# Patient Record
Sex: Female | Born: 1935
Health system: Southern US, Community
[De-identification: ages and names within clinical notes are randomized; demographics above are authoritative.]

## PROBLEM LIST (undated history)

## (undated) DIAGNOSIS — R0989 Other specified symptoms and signs involving the circulatory and respiratory systems: Secondary | ICD-10-CM

## (undated) DIAGNOSIS — M810 Age-related osteoporosis without current pathological fracture: Secondary | ICD-10-CM

## (undated) HISTORY — DX: Age-related osteoporosis without current pathological fracture: M81.0

## (undated) HISTORY — DX: Other specified symptoms and signs involving the circulatory and respiratory systems: R09.89

## (undated) HISTORY — PX: OTHER SURGICAL HISTORY: SHX169

## (undated) HISTORY — PX: TONSILLECTOMY: SHX5217

---

## 2006-11-18 ENCOUNTER — Ambulatory Visit: Payer: Self-pay | Admitting: Internal Medicine

## 2006-11-18 DIAGNOSIS — M810 Age-related osteoporosis without current pathological fracture: Secondary | ICD-10-CM | POA: Insufficient documentation

## 2007-08-11 HISTORY — PX: CATARACT EXTRACTION: SUR2

## 2007-08-27 ENCOUNTER — Ambulatory Visit (HOSPITAL_COMMUNITY): Admission: RE | Admit: 2007-08-27 | Discharge: 2007-08-27 | Payer: Self-pay | Admitting: Internal Medicine

## 2007-10-09 ENCOUNTER — Ambulatory Visit: Payer: Self-pay | Admitting: Internal Medicine

## 2007-10-09 DIAGNOSIS — D239 Other benign neoplasm of skin, unspecified: Secondary | ICD-10-CM

## 2007-10-09 DIAGNOSIS — R0989 Other specified symptoms and signs involving the circulatory and respiratory systems: Secondary | ICD-10-CM

## 2007-10-09 LAB — CONVERTED CEMR LAB
Albumin: 4.1 g/dL (ref 3.5–5.2)
BUN: 16 mg/dL (ref 6–23)
Basophils Absolute: 0.1 10*3/uL (ref 0.0–0.1)
CO2: 28 meq/L (ref 19–32)
Cholesterol: 228 mg/dL (ref 0–200)
Creatinine, Ser: 0.8 mg/dL (ref 0.4–1.2)
GFR calc non Af Amer: 73 mL/min
Glucose, Bld: 102 mg/dL — ABNORMAL HIGH (ref 70–99)
HCT: 39.2 % (ref 36.0–46.0)
Hemoglobin: 13.8 g/dL (ref 12.0–15.0)
MCHC: 35.2 g/dL (ref 30.0–36.0)
Neutrophils Relative %: 50.4 % (ref 43.0–77.0)
Platelets: 260 10*3/uL (ref 150–400)
RBC: 4.33 M/uL (ref 3.87–5.11)
RDW: 12.6 % (ref 11.5–14.6)
Sodium: 139 meq/L (ref 135–145)
TSH: 1.35 microintl units/mL (ref 0.35–5.50)
Total Bilirubin: 0.9 mg/dL (ref 0.3–1.2)
Triglycerides: 62 mg/dL (ref 0–149)
WBC: 3.6 10*3/uL — ABNORMAL LOW (ref 4.5–10.5)

## 2007-10-16 ENCOUNTER — Encounter: Payer: Self-pay | Admitting: Internal Medicine

## 2007-10-16 ENCOUNTER — Ambulatory Visit: Payer: Self-pay

## 2007-11-03 ENCOUNTER — Telehealth: Payer: Self-pay | Admitting: Internal Medicine

## 2007-12-12 ENCOUNTER — Encounter: Payer: Self-pay | Admitting: Internal Medicine

## 2008-08-04 ENCOUNTER — Telehealth: Payer: Self-pay | Admitting: Internal Medicine

## 2008-08-27 ENCOUNTER — Ambulatory Visit (HOSPITAL_COMMUNITY): Admission: RE | Admit: 2008-08-27 | Discharge: 2008-08-27 | Payer: Self-pay | Admitting: Internal Medicine

## 2008-08-27 ENCOUNTER — Encounter: Payer: Self-pay | Admitting: Internal Medicine

## 2009-01-05 ENCOUNTER — Encounter: Payer: Self-pay | Admitting: Internal Medicine

## 2009-01-05 ENCOUNTER — Ambulatory Visit: Payer: Self-pay

## 2009-01-10 ENCOUNTER — Ambulatory Visit: Payer: Self-pay | Admitting: Internal Medicine

## 2009-01-13 LAB — CONVERTED CEMR LAB
Albumin: 4.1 g/dL (ref 3.5–5.2)
Alkaline Phosphatase: 70 units/L (ref 39–117)
BUN: 21 mg/dL (ref 6–23)
Basophils Absolute: 0.1 10*3/uL (ref 0.0–0.1)
Cholesterol: 216 mg/dL — ABNORMAL HIGH (ref 0–200)
Direct LDL: 139.1 mg/dL
GFR calc non Af Amer: 72.71 mL/min (ref >60)
Hemoglobin: 13.8 g/dL (ref 12.0–15.0)
Lymphocytes Relative: 34.4 % (ref 12.0–46.0)
Lymphs Abs: 1.3 10*3/uL (ref 0.7–4.0)
MCHC: 34.7 g/dL (ref 30.0–36.0)
MCV: 95 fL (ref 78.0–100.0)
Neutro Abs: 2.1 10*3/uL (ref 1.4–7.7)
Potassium: 4.3 meq/L (ref 3.5–5.1)
RBC: 4.18 M/uL (ref 3.87–5.11)
RDW: 12.3 % (ref 11.5–14.6)
TSH: 1.27 microintl units/mL (ref 0.35–5.50)
Total Protein: 7.1 g/dL (ref 6.0–8.3)
WBC: 3.8 10*3/uL — ABNORMAL LOW (ref 4.5–10.5)

## 2009-02-09 ENCOUNTER — Ambulatory Visit: Payer: Self-pay | Admitting: Internal Medicine

## 2009-04-04 ENCOUNTER — Encounter (INDEPENDENT_AMBULATORY_CARE_PROVIDER_SITE_OTHER): Payer: Self-pay | Admitting: *Deleted

## 2009-04-05 ENCOUNTER — Ambulatory Visit: Payer: Self-pay | Admitting: Internal Medicine

## 2009-04-21 ENCOUNTER — Ambulatory Visit: Payer: Self-pay | Admitting: Internal Medicine

## 2009-08-10 LAB — HM MAMMOGRAPHY: HM Mammogram: NEGATIVE

## 2009-10-04 ENCOUNTER — Ambulatory Visit (HOSPITAL_COMMUNITY): Admission: RE | Admit: 2009-10-04 | Discharge: 2009-10-04 | Payer: Self-pay | Admitting: Internal Medicine

## 2009-12-14 ENCOUNTER — Encounter: Payer: Self-pay | Admitting: Internal Medicine

## 2009-12-15 ENCOUNTER — Encounter: Payer: Self-pay | Admitting: Internal Medicine

## 2010-04-11 NOTE — Procedures (Signed)
Summary: Colonoscopy  Patient: [name] Note: All result statuses are Final unless otherwise noted.  Tests: (1) Colonoscopy (COL)   COL Colonoscopy           DONE     [facility]     [address]     [place], [place]  [address]           COLONOSCOPY PROCEDURE REPORT           PATIENT:  Tina Schroeder, Tina Schroeder  MR#:  [id number]     BIRTHDATE:  17-Aug-1923, 85 yrs. old  GENDER:  female           ENDOSCOPIST:  Iva Boop, MD, [facility]           PROCEDURE DATE:  04/21/2009     PROCEDURE:  Incomplete colonoscopy     ASA CLASS:  Class II     INDICATIONS:  Routine Risk Screening (no previouscolonoscopy)           MEDICATIONS:   Fentanyl 50 mcg IV, Versed 5 mg IV           DESCRIPTION OF PROCEDURE:   After the risks benefits and     alternatives of the procedure were thoroughly explained, informed     consent was obtained.  Digital rectal exam was performed and     revealed no abnormalities.   The LB [id number]-[id number] endoscope     was introduced through the anus and advanced to the sigmoid colon,     limited by stenosis.  There is angulation and stenosis with fixed     sigmoid colon that prevented passage of the colonoscope (adult and     pediatric adjustable) desoite abdominal pressure and change from     left lateral decubitis to supine.   The quality of the prep was     excellent, using MoviPrep.  The instrument was then slowly     withdrawn as the colon was fully examined.     <<PROCEDUREIMAGES>>           FINDINGS:  Mild diverticulosis was found in the sigmoid colon.     there was stenosis in the sigmoid colon. Angulated, fixed and     stenotic sigmoid at about 30 cm. Unable to pass, see above.  This     was otherwise a normal examination (incomplete).   Retroflexed     views in the rectum revealed no abnormalities.    The scope was     then withdrawn from the patient and the procedure completed.           COMPLICATIONS:  None           ENDOSCOPIC IMPRESSION:  1) Mild diverticulosis in the sigmoid colon     2) Stenosis in the sigmoid colon - preventing further     examination ofthe colon     3) Otherwise normal examination - incomplete exam     RECOMMENDATIONS:     She might want to pursue CT colonoscopy. Will discuss.           REPEAT EXAM:  No           Iva Boop, MD, [facility]           CC:  The Patient           n.     eSIGNED:   Iva Boop at 04/21/2009 12:03 PM           Plaia,  Thersia, Petraglia [id number]  Note: An exclamation mark (!) indicates a result that was not dispersed into the flowsheet. Document Creation Date: 04/21/2009 12:04 PM _______________________________________________________________________  (1) Order result status: Final Collection or observation date-time: 04/21/2009 11:56 Requested date-time:  Receipt date-time:  Reported date-time:  Referring Physician:   Ordering Physician: Stan Head [phone]) Specimen Source:  Source: Launa Grill Order Number: [id number] Lab site:

## 2010-04-11 NOTE — Miscellaneous (Signed)
Summary: [facility] Previsit/prep  Clinical Lists Changes  Medications: Added new medication of MOVIPREP 100 GM  SOLR (PEG-KCL-NACL-NASULF-NA ASC-C) As per prep instructions. - Signed Rx of MOVIPREP 100 GM  SOLR (PEG-KCL-NACL-NASULF-NA ASC-C) As per prep instructions.;  #1 x 0;  Signed;  Entered by: Wyona Almas RN;  Authorized by: Iva Boop MD, [facility];  Method used: Electronically to [facility]. [place]. [phone]*, [address], [place], [place], [place]  [address], Ph: [phone] or [phone], Fax: [phone] Observations: Added new observation of NKA: T (04/05/2009 13:39)    Prescriptions: MOVIPREP 100 GM  SOLR (PEG-KCL-NACL-NASULF-NA ASC-C) As per prep instructions.  #1 x 0   Entered by:   Wyona Almas RN   Authorized by:   Iva Boop MD, [facility]   Signed by:   Wyona Almas RN on 04/05/2009   Method used:   Electronically to        [facility]. [address] [phone]* (retail)       [address]       [place], [place]  [address]       Ph: [phone] or [phone]       Fax: [phone]   RxID:   [id number]

## 2010-04-11 NOTE — Miscellaneous (Signed)
Summary: Flu Shot/[facility]  Flu Shot/[facility]   Imported By: Maryln Gottron 12/19/2009 12:16:19  _____________________________________________________________________  External Attachment:    Type:   Image     Comment:   External Document

## 2010-04-11 NOTE — Miscellaneous (Signed)
Summary: flu vaccine  Clinical Lists Changes  Observations: Added new observation of FLU VAX: Historical (12/14/2009 11:54)      Immunization History:  Tetanus/Td Immunization History:    Tetanus/Td:  Td (01/10/2009)  Influenza Immunization History:    Influenza:  Historical (12/14/2009)  Pneumovax Immunization History:    Pneumovax:  Pneumovax (03/12/2002) given at [facility]

## 2010-04-11 NOTE — Letter (Signed)
Summary: [facility] Instructions  [facility]  [address] [place], [place] [address]   Phone: [phone]  Fax: [phone]       NIMSI MALES    1923/06/29    MRN: [id number]        Procedure Day Dorna Bloom:  Lenor Coffin  04/21/09     Arrival Time:  10:00AM     Procedure Time:  11:00AM     Location of Procedure:                    Juliann Pares _  [facility] (4th Floor)                        PREPARATION FOR COLONOSCOPY WITH MOVIPREP   Starting 5 days prior to your procedure 04/16/09 do not eat nuts, seeds, popcorn, corn, beans, peas,  salads, or any raw vegetables.  Do not take any fiber supplements (e.g. Metamucil, Citrucel, and Benefiber).  THE DAY BEFORE YOUR PROCEDURE         DATE: 04/20/09  DAY: WEDNESDAY  1.  Drink clear liquids the entire day-NO SOLID FOOD  2.  Do not drink anything colored red or purple.  Avoid juices with pulp.  No orange juice.  3.  Drink at least 64 oz. (8 glasses) of fluid/clear liquids during the day to prevent dehydration and help the prep work efficiently.  CLEAR LIQUIDS INCLUDE: Water Jello Ice Popsicles Tea (sugar ok, no milk/cream) Powdered fruit flavored drinks Coffee (sugar ok, no milk/cream) Gatorade Juice: apple, white grape, white cranberry  Lemonade Clear bullion, consomm, broth Carbonated beverages (any kind) Strained chicken noodle soup Hard Candy                             4.  In the morning, mix first dose of MoviPrep solution:    Empty 1 Pouch A and 1 Pouch B into the disposable container    Add lukewarm drinking water to the top line of the container. Mix to dissolve    Refrigerate (mixed solution should be used within 24 hrs)  5.  Begin drinking the prep at 5:00 p.m. The MoviPrep container is divided by 4 marks.   Every 15 minutes drink the solution down to the next mark (approximately 8 oz) until the full liter is complete.   6.  Follow completed prep with 16 oz of clear liquid of your choice  (Nothing red or purple).  Continue to drink clear liquids until bedtime.  7.  Before going to bed, mix second dose of MoviPrep solution:    Empty 1 Pouch A and 1 Pouch B into the disposable container    Add lukewarm drinking water to the top line of the container. Mix to dissolve    Refrigerate  THE DAY OF YOUR PROCEDURE      DATE: 04/21/09 DAY: THURSDAY  Beginning at 6:00AM (5 hours before procedure):         1. Every 15 minutes, drink the solution down to the next mark (approx 8 oz) until the full liter is complete.  2. Follow completed prep with 16 oz. of clear liquid of your choice.    3. You may drink clear liquids until 9:00AM (2 HOURS BEFORE PROCEDURE).   MEDICATION INSTRUCTIONS  Unless otherwise instructed, you should take regular prescription medications with a small sip of water   as early as possible the morning of  your procedure.        OTHER INSTRUCTIONS  You will need a responsible adult at least 75 years of age to accompany you and drive you home.   This person must remain in the waiting room during your procedure.  Wear loose fitting clothing that is easily removed.  Leave jewelry and other valuables at home.  However, you may wish to bring a book to read or  an iPod/MP3 player to listen to music as you wait for your procedure to start.  Remove all body piercing jewelry and leave at home.  Total time from sign-in until discharge is approximately 2-3 hours.  You should go home directly after your procedure and rest.  You can resume normal activities the  day after your procedure.  The day of your procedure you should not:   Drive   Make legal decisions   Operate machinery   Drink alcohol   Return to work  You will receive specific instructions about eating, activities and medications before you leave.    The above instructions have been reviewed and explained to me by  Wyona Almas RN  April 05, 2009 2:25 PM     I fully understand  and can verbalize these instructions _____________________________ Date _________

## 2010-07-14 ENCOUNTER — Telehealth: Payer: Self-pay | Admitting: Internal Medicine

## 2010-07-14 NOTE — Telephone Encounter (Signed)
Opened in error

## 2010-09-06 ENCOUNTER — Encounter: Payer: Self-pay | Admitting: Internal Medicine

## 2010-09-07 ENCOUNTER — Encounter: Payer: Self-pay | Admitting: Internal Medicine

## 2010-09-07 ENCOUNTER — Ambulatory Visit (INDEPENDENT_AMBULATORY_CARE_PROVIDER_SITE_OTHER): Payer: Medicare Other | Admitting: Internal Medicine

## 2010-09-07 VITALS — BP 130/80 | HR 78 | Temp 97.5°F | Resp 16 | Ht 61.0 in | Wt 137.0 lb

## 2010-09-07 DIAGNOSIS — R0989 Other specified symptoms and signs involving the circulatory and respiratory systems: Secondary | ICD-10-CM

## 2010-09-07 DIAGNOSIS — M810 Age-related osteoporosis without current pathological fracture: Secondary | ICD-10-CM

## 2010-09-07 DIAGNOSIS — Z0000 Encounter for general adult medical examination without abnormal findings: Secondary | ICD-10-CM

## 2010-09-07 DIAGNOSIS — Z136 Encounter for screening for cardiovascular disorders: Secondary | ICD-10-CM

## 2010-09-07 LAB — CBC WITH DIFFERENTIAL/PLATELET
Eosinophils Relative: 1.3 % (ref 0.0–5.0)
HCT: 39.7 % (ref 36.0–46.0)
Lymphocytes Relative: 37.9 % (ref 12.0–46.0)
Lymphs Abs: 1.6 10*3/uL (ref 0.7–4.0)
Monocytes Absolute: 0.4 10*3/uL (ref 0.1–1.0)
Monocytes Relative: 9 % (ref 3.0–12.0)
Neutro Abs: 2.1 10*3/uL (ref 1.4–7.7)
Neutrophils Relative %: 50.8 % (ref 43.0–77.0)

## 2010-09-07 LAB — HEPATIC FUNCTION PANEL
ALT: 20 U/L (ref 0–35)
AST: 27 U/L (ref 0–37)
Albumin: 4.1 g/dL (ref 3.5–5.2)
Total Protein: 6.5 g/dL (ref 6.0–8.3)

## 2010-09-07 LAB — BASIC METABOLIC PANEL: BUN: 26 mg/dL — ABNORMAL HIGH (ref 6–23)

## 2010-09-07 LAB — LDL CHOLESTEROL, DIRECT: Direct LDL: 140.5 mg/dL

## 2010-09-07 LAB — TSH: TSH: 1.19 u[IU]/mL (ref 0.35–5.50)

## 2010-09-07 LAB — LIPID PANEL
HDL: 68.6 mg/dL (ref >39.00)
Triglycerides: 85 mg/dL (ref 0.0–149.0)
VLDL: 17 mg/dL (ref 0.0–40.0)

## 2010-09-07 MED ORDER — ASPIRIN 81 MG PO CHEW: 81.0000 mg | CHEWABLE_TABLET | Freq: Every day | ORAL | Status: AC

## 2010-09-07 NOTE — Patient Instructions (Signed)
Take a calcium supplement, plus [phone] units of vitamin D  Take aspirin 81 mg daily    It is important that you exercise regularly, at least 20 minutes 3 to 4 times per week.  If you develop chest pain or shortness of breath seek  medical attention.  Bone density study and mammogram as discussed  Return in one year for follow-up

## 2010-09-07 NOTE — Progress Notes (Signed)
Subjective:    Patient ID: Tina Schroeder, female    DOB: 1923/10/03, 77 y.o.   MRN: [id number]  HPI  77 year old patient who is seen today for an annual health exam. She has a history of a right carotid bruit which has been asymptomatic. She also is treated osteoporosis Fosamax therapy was discontinued approximately 2 years ago at the time of her last bone density study she had been on this medication in excess of 5 years. She remains on calcium and vitamin D supplements. She is asymptomatic. Denies any focal neurological symptoms.    77 year-old female . She has relocated from the [place] area. She has history of osteoporosis. Otherwise her past history is fairly unremarkable  Current Allergies:  No known allergies  Past Medical History:  Osteoporosis  glaucoma  Past Surgical History:  childhood tonsillectomy, 1932  gravida two, para two, abortus zero  Family History:  father died age 15 Mother died at age 41, leukemia  no siblings  Social History:  relocate her from the [place] daughter lives in [place], who is a Astronomer. at [employer]  Risk Factors:  Tobacco use: never    Past Medical History  Diagnosis Date  . OP (osteoporosis)   . Glaucoma   . Carotid bruit     right   Past Surgical History  Procedure Date  . Tonsillectomy     childhood 1932  . Cataract extraction 08/2007    reports that she has never smoked. She has never used smokeless tobacco. She reports that she does not drink alcohol or use illicit drugs. family history is not on file. No Known Allergies   1. Risk factors, based on past  M,S,F history-  cardiovascular risk factors include age. She has a history of a right carotid bruit 2.  Physical activities: Remains quite active and independent no exercise limitations  3.  Depression/mood: History depression or mood disorder  4.  Hearing: No deficits  5.  ADL's: Independent in all aspects of daily living  6.  Fall risk: Low  7.  Home safety: No  problems identified  8.  Height weight, and visual acuity; height and weight stable she is followed by ophthalmology for glaucoma. She has a history of bilateral cataract extraction surgery  9.  Counseling: Heart healthy diet active lifestyle all encouraged baby aspirin 81 mg daily recommended  10. Lab orders based on risk factors: Laboratory profile will be reviewed  11. Referral: Followup ophthalmology.  12. Care plan: Aspirin 81 mg daily she'll continue calcium and vitamin D supplementation. A followup bone density study will be obtained and we'll consider further treatment of osteoporosis if indicated  13. Cognitive assessment: Alert and oriented normal affect. No cognitive dysfunction     Review of Systems  Constitutional: Negative for fever, appetite change, fatigue and unexpected weight change.  HENT: Negative for hearing loss, ear pain, nosebleeds, congestion, sore throat, mouth sores, trouble swallowing, neck stiffness, dental problem, voice change, sinus pressure and tinnitus.   Eyes: Negative for photophobia, pain, redness and visual disturbance.  Respiratory: Negative for cough, chest tightness and shortness of breath.   Cardiovascular: Negative for chest pain, palpitations and leg swelling.  Gastrointestinal: Negative for nausea, vomiting, abdominal pain, diarrhea, constipation, blood in stool, abdominal distention and rectal pain.  Genitourinary: Negative for dysuria, urgency, frequency, hematuria, flank pain, vaginal bleeding, vaginal discharge, difficulty urinating, genital sores, vaginal pain, menstrual problem and pelvic pain.  Musculoskeletal: Negative for back pain and arthralgias.  Skin:  Negative for rash.  Neurological: Negative for dizziness, syncope, speech difficulty, weakness, light-headedness, numbness and headaches.  Hematological: Negative for adenopathy. Does not bruise/bleed easily.  Psychiatric/Behavioral: Negative for suicidal ideas, behavioral problems,  self-injury, dysphoric mood and agitation. The patient is not nervous/anxious.        Objective:   Physical Exam  Constitutional: She is oriented to person, place, and time. She appears well-developed and well-nourished.  HENT:  Head: Normocephalic and atraumatic.  Right Ear: External ear normal.  Left Ear: External ear normal.  Mouth/Throat: Oropharynx is clear and moist.  Eyes: Conjunctivae and EOM are normal.  Neck: Normal range of motion. Neck supple. No JVD present. No thyromegaly present.       Faint right carotid bruit  Cardiovascular: Normal rate, regular rhythm, normal heart sounds and intact distal pulses.   No murmur heard. Pulmonary/Chest: Effort normal and breath sounds normal. She has no wheezes. She has no rales.       Kyphosis  Abdominal: Soft. Bowel sounds are normal. She exhibits no distension and no mass. There is no tenderness. There is no rebound and no guarding.  Musculoskeletal: Normal range of motion. She exhibits no edema and no tenderness.  Neurological: She is alert and oriented to person, place, and time. She has normal reflexes. No cranial nerve deficit. She exhibits normal muscle tone. Coordination normal.  Skin: Skin is warm and dry. No rash noted.  Psychiatric: She has a normal mood and affect. Her behavior is normal.          Assessment & Plan:   Annual health examination Osteoporosis Right carotid bruit  A bone density study will be reviewed. Daily aspirin 81 mg daily recommended Return in one year for followup

## 2010-09-11 ENCOUNTER — Telehealth: Payer: Self-pay | Admitting: Internal Medicine

## 2010-09-11 NOTE — Telephone Encounter (Signed)
Pt req to get copy of lab results mailed to pts home.

## 2010-09-11 NOTE — Telephone Encounter (Signed)
mailed

## 2010-09-14 ENCOUNTER — Other Ambulatory Visit: Payer: Self-pay | Admitting: Internal Medicine

## 2010-09-14 DIAGNOSIS — Z1231 Encounter for screening mammogram for malignant neoplasm of breast: Secondary | ICD-10-CM

## 2010-10-06 ENCOUNTER — Ambulatory Visit (HOSPITAL_COMMUNITY)
Admission: RE | Admit: 2010-10-06 | Discharge: 2010-10-06 | Disposition: A | Discharge status: Home / Self Care | Payer: Medicare Other | Source: Ambulatory Visit | Attending: Internal Medicine | Admitting: Internal Medicine

## 2010-10-06 DIAGNOSIS — Z1231 Encounter for screening mammogram for malignant neoplasm of breast: Secondary | ICD-10-CM | POA: Insufficient documentation

## 2010-10-06 DIAGNOSIS — Z13820 Encounter for screening for osteoporosis: Secondary | ICD-10-CM | POA: Insufficient documentation

## 2010-10-06 DIAGNOSIS — M810 Age-related osteoporosis without current pathological fracture: Secondary | ICD-10-CM

## 2010-10-06 DIAGNOSIS — Z780 Asymptomatic menopausal state: Secondary | ICD-10-CM | POA: Insufficient documentation

## 2010-10-19 ENCOUNTER — Encounter: Payer: Self-pay | Admitting: Internal Medicine

## 2011-04-10 DIAGNOSIS — Z961 Presence of intraocular lens: Secondary | ICD-10-CM | POA: Diagnosis not present

## 2011-04-10 DIAGNOSIS — H4010X0 Unspecified open-angle glaucoma, stage unspecified: Secondary | ICD-10-CM | POA: Diagnosis not present

## 2011-04-10 DIAGNOSIS — H18419 Arcus senilis, unspecified eye: Secondary | ICD-10-CM | POA: Diagnosis not present

## 2011-04-10 DIAGNOSIS — H409 Unspecified glaucoma: Secondary | ICD-10-CM | POA: Diagnosis not present

## 2011-05-08 DIAGNOSIS — H409 Unspecified glaucoma: Secondary | ICD-10-CM | POA: Diagnosis not present

## 2011-05-08 DIAGNOSIS — H4010X0 Unspecified open-angle glaucoma, stage unspecified: Secondary | ICD-10-CM | POA: Diagnosis not present

## 2011-09-14 ENCOUNTER — Other Ambulatory Visit: Payer: Self-pay | Admitting: Internal Medicine

## 2011-09-14 DIAGNOSIS — Z1231 Encounter for screening mammogram for malignant neoplasm of breast: Secondary | ICD-10-CM

## 2011-10-10 ENCOUNTER — Ambulatory Visit (HOSPITAL_COMMUNITY)
Admission: RE | Admit: 2011-10-10 | Discharge: 2011-10-10 | Disposition: A | Discharge status: Home / Self Care | Payer: Medicare Other | Source: Ambulatory Visit | Attending: Internal Medicine | Admitting: Internal Medicine

## 2011-10-10 DIAGNOSIS — Z1231 Encounter for screening mammogram for malignant neoplasm of breast: Secondary | ICD-10-CM | POA: Diagnosis not present

## 2011-12-22 DIAGNOSIS — Z23 Encounter for immunization: Secondary | ICD-10-CM | POA: Diagnosis not present

## 2012-01-18 DIAGNOSIS — H409 Unspecified glaucoma: Secondary | ICD-10-CM | POA: Diagnosis not present

## 2012-01-18 DIAGNOSIS — H4010X0 Unspecified open-angle glaucoma, stage unspecified: Secondary | ICD-10-CM | POA: Diagnosis not present

## 2012-01-18 DIAGNOSIS — Z961 Presence of intraocular lens: Secondary | ICD-10-CM | POA: Diagnosis not present

## 2012-03-11 ENCOUNTER — Encounter: Payer: Self-pay | Admitting: Internal Medicine

## 2012-03-11 ENCOUNTER — Ambulatory Visit (INDEPENDENT_AMBULATORY_CARE_PROVIDER_SITE_OTHER): Payer: Medicare Other | Admitting: Internal Medicine

## 2012-03-11 VITALS — BP 126/80 | HR 86 | Temp 97.8°F | Resp 18 | Ht 60.75 in | Wt 138.0 lb

## 2012-03-11 DIAGNOSIS — Z0000 Encounter for general adult medical examination without abnormal findings: Secondary | ICD-10-CM | POA: Diagnosis not present

## 2012-03-11 DIAGNOSIS — M810 Age-related osteoporosis without current pathological fracture: Secondary | ICD-10-CM

## 2012-03-11 DIAGNOSIS — R0989 Other specified symptoms and signs involving the circulatory and respiratory systems: Secondary | ICD-10-CM

## 2012-03-11 LAB — COMPREHENSIVE METABOLIC PANEL
ALT: 25 U/L (ref 0–35)
Creatinine, Ser: 0.8 mg/dL (ref 0.4–1.2)
Glucose, Bld: 103 mg/dL — ABNORMAL HIGH (ref 70–99)
Potassium: 3.6 mEq/L (ref 3.5–5.1)
Total Bilirubin: 1.1 mg/dL (ref 0.3–1.2)

## 2012-03-11 LAB — CBC WITH DIFFERENTIAL/PLATELET
Basophils Absolute: 0.1 10*3/uL (ref 0.0–0.1)
Basophils Relative: 1.4 % (ref 0.0–3.0)
Eosinophils Absolute: 0.1 10*3/uL (ref 0.0–0.7)
Eosinophils Relative: 1.6 % (ref 0.0–5.0)
HCT: 39.8 % (ref 36.0–46.0)
Hemoglobin: 13.3 g/dL (ref 12.0–15.0)
Lymphocytes Relative: 40.1 % (ref 12.0–46.0)
MCHC: 33.5 g/dL (ref 30.0–36.0)
Monocytes Relative: 9.3 % (ref 3.0–12.0)
Platelets: 283 10*3/uL (ref 150.0–400.0)
RDW: 14.4 % (ref 11.5–14.6)

## 2012-03-11 LAB — TSH: TSH: 1.74 u[IU]/mL (ref 0.35–5.50)

## 2012-03-11 NOTE — Progress Notes (Signed)
Patient ID: Tina Schroeder, female   DOB: [birth year], 78 y.o.   MRN: [id number]  Subjective:    Patient ID: Tina Schroeder, female    DOB: 1923-09-19, 78 y.o.   MRN: [id number]  HPI  78 year-old patient who is seen today for an annual health exam. She has a history of a right carotid bruit which has been asymptomatic. She also is treated osteoporosis Fosamax therapy was discontinued approximately 3 years ago at the time of her last bone density study she had been on this medication in excess of 5 years. She remains on calcium and vitamin D supplements. She is asymptomatic. Denies any focal neurological symptoms.    79 year-old female . She has relocated from the [place] area. She has history of osteoporosis. Otherwise her past history is fairly unremarkable   Current Allergies:  No known allergies   Past Medical History:  Osteoporosis  glaucoma   Past Surgical History:  childhood tonsillectomy, 1932  gravida two, para two, abortus zero  Status post cataract extraction surgery  Family History:  father died age 48 Mother died at age 40, leukemia  no siblings   Social History:  relocate her from the [place] daughter lives in [place], who is a Astronomer. at [employer]   Risk Factors:  Tobacco use: never    Past Medical History  Diagnosis Date  . OP (osteoporosis)   . Glaucoma   . Carotid bruit     right   Past Surgical History  Procedure Date  . Tonsillectomy     childhood 1932  . Cataract extraction 08/2007    reports that she has never smoked. She has never used smokeless tobacco. She reports that she does not drink alcohol or use illicit drugs. family history is not on file. No Known Allergies   1. Risk factors, based on past  M,S,F history-  cardiovascular risk factors include age. She has a history of a right carotid bruit 2.  Physical activities: Remains quite active and independent no exercise limitations  3.  Depression/mood: History depression or mood  disorder  4.  Hearing: No deficits  5.  ADL's: Independent in all aspects of daily living  6.  Fall risk: Low  7.  Home safety: No problems identified  8.  Height weight, and visual acuity; height and weight stable she is followed by ophthalmology for glaucoma. She has a history of bilateral cataract extraction surgery  9.  Counseling: Heart healthy diet active lifestyle all encouraged baby aspirin 81 mg daily recommended  10. Lab orders based on risk factors: Laboratory profile will be reviewed  11. Referral: Followup ophthalmology.  12. Care plan: Aspirin 81 mg daily she'll continue calcium and vitamin D supplementation. A followup bone density study will be obtained and we'll consider further treatment of osteoporosis if indicated  13. Cognitive assessment: Alert and oriented normal affect. No cognitive dysfunction  Past Medical History  Diagnosis Date  . OP (osteoporosis)   . Glaucoma   . Carotid bruit     right    History   Social History  . Marital Status: Widowed    Spouse Name: N/A    Number of Children: N/A  . Years of Education: N/A   Occupational History  . Not on file.   Social History Main Topics  . Smoking status: Never Smoker   . Smokeless tobacco: Never Used  . Alcohol Use: No  . Drug Use: No  . Sexually Active: Not on file  Other Topics Concern  . Not on file   Social History Narrative  . No narrative on file    Past Surgical History  Procedure Date  . Tonsillectomy     childhood 1932  . Cataract extraction 08/2007    No family history on file.  No Known Allergies  Current Outpatient Prescriptions on File Prior to Visit  Medication Sig Dispense Refill  . calcium-vitamin D 250-100 MG-UNIT per tablet Take 1 tablet by mouth daily.        . Cholecalciferol (VITAMIN D) 1000 UNITS capsule Take 1,000 Units by mouth daily.        . Multiple Vitamins-Minerals (CENTRUM SILVER PO) Take 1 tablet by mouth daily.        . travoprost,  benzalkonium, (TRAVATAN) 0.004 % ophthalmic solution Apply 1 drop to eye at bedtime.        . vitamin C (ASCORBIC ACID) 500 MG tablet Take 500 mg by mouth daily.          BP 126/80  Pulse 86  Temp 97.8 F (36.6 C) (Oral)  Resp 18  Ht 5' 0.75" (1.543 m)  Wt 138 lb (62.596 kg)  BMI 26.29 kg/m2  SpO2 96%      Review of Systems  Constitutional: Negative for fever, appetite change, fatigue and unexpected weight change.  HENT: Negative for hearing loss, ear pain, nosebleeds, congestion, sore throat, mouth sores, trouble swallowing, neck stiffness, dental problem, voice change, sinus pressure and tinnitus.   Eyes: Negative for photophobia, pain, redness and visual disturbance.  Respiratory: Negative for cough, chest tightness and shortness of breath.   Cardiovascular: Negative for chest pain, palpitations and leg swelling.  Gastrointestinal: Negative for nausea, vomiting, abdominal pain, diarrhea, constipation, blood in stool, abdominal distention and rectal pain.  Genitourinary: Negative for dysuria, urgency, frequency, hematuria, flank pain, vaginal bleeding, vaginal discharge, difficulty urinating, genital sores, vaginal pain, menstrual problem and pelvic pain.  Musculoskeletal: Negative for back pain and arthralgias.  Skin: Negative for rash.  Neurological: Negative for dizziness, syncope, speech difficulty, weakness, light-headedness, numbness and headaches.  Hematological: Negative for adenopathy. Does not bruise/bleed easily.  Psychiatric/Behavioral: Negative for suicidal ideas, behavioral problems, self-injury, dysphoric mood and agitation. The patient is not nervous/anxious.        Objective:   Physical Exam  Constitutional: She is oriented to person, place, and time. She appears well-developed and well-nourished.  HENT:  Head: Normocephalic and atraumatic.  Right Ear: External ear normal.  Left Ear: External ear normal.  Mouth/Throat: Oropharynx is clear and moist.  Eyes:  Conjunctivae normal and EOM are normal.  Neck: Normal range of motion. Neck supple. No JVD present. No thyromegaly present.       High-pitched right carotid bruit  Cardiovascular: Normal rate, regular rhythm, normal heart sounds and intact distal pulses.   No murmur heard.      Posterior tibial pulses full. Dorsalis pedis pulses not easily palpable  Pulmonary/Chest: Effort normal and breath sounds normal. She has no wheezes. She has no rales.       Kyphosis  Abdominal: Soft. Bowel sounds are normal. She exhibits no distension and no mass. There is no tenderness. There is no rebound and no guarding.  Musculoskeletal: Normal range of motion. She exhibits no edema and no tenderness.  Neurological: She is alert and oriented to person, place, and time. She has normal reflexes. No cranial nerve deficit. She exhibits normal muscle tone. Coordination normal.  Skin: Skin is warm and dry. No rash noted.  Psychiatric: She has a normal mood and affect. Her behavior is normal.          Assessment & Plan:   Annual health examination Osteoporosis Right carotid bruit  A bone density study will be reviewed. Daily aspirin 81 mg daily recommended Return in one year for followup Followup carotid artery Doppler will be obtained Recheck 1 year

## 2012-03-11 NOTE — Patient Instructions (Signed)
Take a calcium supplement, plus [phone] units of vitamin D    It is important that you exercise regularly, at least 20 minutes 3 to 4 times per week.  If you develop chest pain or shortness of breath seek  medical attention.  Return in one year for follow-up  Duplex study as discussed

## 2012-03-19 ENCOUNTER — Encounter (INDEPENDENT_AMBULATORY_CARE_PROVIDER_SITE_OTHER): Payer: Medicare Other

## 2012-03-19 DIAGNOSIS — R0989 Other specified symptoms and signs involving the circulatory and respiratory systems: Secondary | ICD-10-CM | POA: Diagnosis not present

## 2012-03-19 DIAGNOSIS — I6529 Occlusion and stenosis of unspecified carotid artery: Secondary | ICD-10-CM

## 2012-04-26 ENCOUNTER — Other Ambulatory Visit: Payer: Self-pay

## 2012-08-29 DIAGNOSIS — H409 Unspecified glaucoma: Secondary | ICD-10-CM | POA: Diagnosis not present

## 2012-08-29 DIAGNOSIS — H18419 Arcus senilis, unspecified eye: Secondary | ICD-10-CM | POA: Diagnosis not present

## 2012-08-29 DIAGNOSIS — H4010X0 Unspecified open-angle glaucoma, stage unspecified: Secondary | ICD-10-CM | POA: Diagnosis not present

## 2012-12-04 ENCOUNTER — Other Ambulatory Visit: Payer: Self-pay | Admitting: Internal Medicine

## 2012-12-04 DIAGNOSIS — Z1231 Encounter for screening mammogram for malignant neoplasm of breast: Secondary | ICD-10-CM

## 2012-12-08 DIAGNOSIS — Z23 Encounter for immunization: Secondary | ICD-10-CM | POA: Diagnosis not present

## 2012-12-23 ENCOUNTER — Ambulatory Visit (HOSPITAL_COMMUNITY)
Admission: RE | Admit: 2012-12-23 | Discharge: 2012-12-23 | Disposition: A | Discharge status: Home / Self Care | Payer: Medicare Other | Source: Ambulatory Visit | Attending: Internal Medicine | Admitting: Internal Medicine

## 2012-12-23 DIAGNOSIS — Z1231 Encounter for screening mammogram for malignant neoplasm of breast: Secondary | ICD-10-CM | POA: Insufficient documentation

## 2012-12-25 DIAGNOSIS — L821 Other seborrheic keratosis: Secondary | ICD-10-CM | POA: Diagnosis not present

## 2012-12-25 DIAGNOSIS — D485 Neoplasm of uncertain behavior of skin: Secondary | ICD-10-CM | POA: Diagnosis not present

## 2012-12-25 DIAGNOSIS — C44319 Basal cell carcinoma of skin of other parts of face: Secondary | ICD-10-CM | POA: Diagnosis not present

## 2012-12-25 DIAGNOSIS — L723 Sebaceous cyst: Secondary | ICD-10-CM | POA: Diagnosis not present

## 2013-01-15 ENCOUNTER — Other Ambulatory Visit: Payer: Self-pay

## 2013-02-24 DIAGNOSIS — C44319 Basal cell carcinoma of skin of other parts of face: Secondary | ICD-10-CM | POA: Diagnosis not present

## 2013-02-25 DIAGNOSIS — Z85828 Personal history of other malignant neoplasm of skin: Secondary | ICD-10-CM | POA: Diagnosis not present

## 2013-02-25 DIAGNOSIS — C4491 Basal cell carcinoma of skin, unspecified: Secondary | ICD-10-CM | POA: Diagnosis not present

## 2013-02-25 DIAGNOSIS — L905 Scar conditions and fibrosis of skin: Secondary | ICD-10-CM | POA: Diagnosis not present

## 2013-03-17 DIAGNOSIS — Z961 Presence of intraocular lens: Secondary | ICD-10-CM | POA: Diagnosis not present

## 2013-03-17 DIAGNOSIS — H02839 Dermatochalasis of unspecified eye, unspecified eyelid: Secondary | ICD-10-CM | POA: Diagnosis not present

## 2013-03-17 DIAGNOSIS — H4010X0 Unspecified open-angle glaucoma, stage unspecified: Secondary | ICD-10-CM | POA: Diagnosis not present

## 2013-03-17 DIAGNOSIS — H409 Unspecified glaucoma: Secondary | ICD-10-CM | POA: Diagnosis not present

## 2013-04-07 DIAGNOSIS — C44319 Basal cell carcinoma of skin of other parts of face: Secondary | ICD-10-CM | POA: Diagnosis not present

## 2013-07-08 ENCOUNTER — Encounter: Payer: Self-pay | Admitting: Internal Medicine

## 2013-07-08 ENCOUNTER — Ambulatory Visit (INDEPENDENT_AMBULATORY_CARE_PROVIDER_SITE_OTHER): Payer: Medicare Other | Admitting: Internal Medicine

## 2013-07-08 VITALS — BP 140/88 | HR 69 | Temp 97.7°F | Resp 20 | Ht 60.5 in | Wt 135.0 lb

## 2013-07-08 DIAGNOSIS — E785 Hyperlipidemia, unspecified: Secondary | ICD-10-CM | POA: Insufficient documentation

## 2013-07-08 DIAGNOSIS — Z23 Encounter for immunization: Secondary | ICD-10-CM | POA: Diagnosis not present

## 2013-07-08 DIAGNOSIS — Z0000 Encounter for general adult medical examination without abnormal findings: Secondary | ICD-10-CM

## 2013-07-08 DIAGNOSIS — M810 Age-related osteoporosis without current pathological fracture: Secondary | ICD-10-CM

## 2013-07-08 DIAGNOSIS — R0989 Other specified symptoms and signs involving the circulatory and respiratory systems: Secondary | ICD-10-CM

## 2013-07-08 LAB — LIPID PANEL
CHOL/HDL RATIO: 3
CHOLESTEROL: 207 mg/dL — AB (ref 0–200)
HDL: 70 mg/dL (ref >39.00)
LDL CALC: 124 mg/dL — AB (ref 0–99)
Triglycerides: 67 mg/dL (ref 0.0–149.0)
VLDL: 13.4 mg/dL (ref 0.0–40.0)

## 2013-07-08 LAB — COMPREHENSIVE METABOLIC PANEL
ALT: 23 U/L (ref 0–35)
AST: 24 U/L (ref 0–37)
Albumin: 4 g/dL (ref 3.5–5.2)
Alkaline Phosphatase: 62 U/L (ref 39–117)
BILIRUBIN TOTAL: 0.8 mg/dL (ref 0.3–1.2)
BUN: 24 mg/dL — AB (ref 6–23)
CALCIUM: 10.2 mg/dL (ref 8.4–10.5)
CO2: 28 mEq/L (ref 19–32)
Chloride: 105 mEq/L (ref 96–112)
Creatinine, Ser: 0.8 mg/dL (ref 0.4–1.2)
GFR: 68 mL/min (ref >60.00)
Glucose, Bld: 97 mg/dL (ref 70–99)
Potassium: 4 mEq/L (ref 3.5–5.1)
Sodium: 139 mEq/L (ref 135–145)
Total Protein: 7.1 g/dL (ref 6.0–8.3)

## 2013-07-08 LAB — CBC WITH DIFFERENTIAL/PLATELET
BASOS ABS: 0.1 10*3/uL (ref 0.0–0.1)
BASOS PCT: 0.8 % (ref 0.0–3.0)
Eosinophils Absolute: 0.1 10*3/uL (ref 0.0–0.7)
Eosinophils Relative: 1.2 % (ref 0.0–5.0)
HEMATOCRIT: 41.3 % (ref 36.0–46.0)
HEMOGLOBIN: 13.8 g/dL (ref 12.0–15.0)
LYMPHS PCT: 26 % (ref 12.0–46.0)
Lymphs Abs: 1.8 10*3/uL (ref 0.7–4.0)
MCHC: 33.3 g/dL (ref 30.0–36.0)
MCV: 93.3 fl (ref 78.0–100.0)
MONOS PCT: 5.7 % (ref 3.0–12.0)
Monocytes Absolute: 0.4 10*3/uL (ref 0.1–1.0)
Neutro Abs: 4.5 10*3/uL (ref 1.4–7.7)
Neutrophils Relative %: 66.3 % (ref 43.0–77.0)
Platelets: 265 10*3/uL (ref 150.0–400.0)
RBC: 4.43 Mil/uL (ref 3.87–5.11)
RDW: 14.1 % (ref 11.5–14.6)
WBC: 6.8 10*3/uL (ref 4.5–10.5)

## 2013-07-08 LAB — TSH: TSH: 1.32 u[IU]/mL (ref 0.35–5.50)

## 2013-07-08 MED ORDER — PRAVASTATIN SODIUM 20 MG PO TABS: 20.0000 mg | ORAL_TABLET | Freq: Every day | ORAL | Status: DC

## 2013-07-08 NOTE — Progress Notes (Signed)
Patient ID: Tina Schroeder, female   DOB: 1924/01/10, 80 y.o.   MRN: [id number]  Subjective:    Patient ID: Tina Schroeder, female    DOB: 30-Jul-1923, 80 y.o.   MRN: [id number]  HPI 80 year-old patient who is seen today for an annual health exam.  She has a history of a right carotid bruit which has been asymptomatic. She also has been treated for osteoporosis;  Fosamax therapy was discontinued approximately 4 years ago at the time of her last bone density study;  she had been on this medication in excess of 5 years. She remains on calcium and vitamin D supplements. She is asymptomatic. Denies any focal neurological symptoms.  Followup bone density study approximately 3 years ago revealed slight progression but still fairly mild osteoporosis (-2.7).  She does complain of loss of height and no pain.  Denies any focal neurological symptoms   Current Allergies:  No known allergies   Past Medical History:  Osteoporosis  glaucoma   Past Surgical History:  childhood tonsillectomy, 1932  gravida two, para two, abortus zero  Status post cataract extraction surgery  Family History:  father died age 65 Mother died at age 68, leukemia  no siblings   Social History:  relocate her from the [place] daughter lives in [place], who is a Programmer, applications. at [employer] Factors:  Tobacco use: never    Past Medical History  Diagnosis Date  . OP (osteoporosis)   . Glaucoma   . Carotid bruit     right   Past Surgical History  Procedure Laterality Date  . Tonsillectomy      childhood 1932  . Cataract extraction  08/2007    reports that she has never smoked. She has never used smokeless tobacco. She reports that she does not drink alcohol or use illicit drugs. family history is not on file. No Known Allergies   1. Risk factors, based on past  M,S,F history-  cardiovascular risk factors include age. She has a history of a right carotid bruit 2.  Physical activities: Remains quite active and  independent no exercise limitations  3.  Depression/mood: History depression or mood disorder  4.  Hearing: No deficits  5.  ADL's: Independent in all aspects of daily living  6.  Fall risk: Low  7.  Home safety: No problems identified  8.  Height weight, and visual acuity; height and weight stable she is followed by ophthalmology for glaucoma. She has a history of bilateral cataract extraction surgery  9.  Counseling: Heart healthy diet active lifestyle all encouraged baby aspirin 81 mg daily recommended  10. Lab orders based on risk factors: Laboratory profile will be reviewed  11. Referral: Followup ophthalmology.  12. Care plan: Aspirin 81 mg daily she'll continue calcium and vitamin D supplementation. A followup bone density study will be obtained and we'll consider further treatment of osteoporosis if indicated  13. Cognitive assessment: Alert and oriented normal affect. No cognitive dysfunction  Past Medical History  Diagnosis Date  . OP (osteoporosis)   . Glaucoma   . Carotid bruit     right    History   Social History  . Marital Status: Widowed    Spouse Name: N/A    Number of Children: N/A  . Years of Education: N/A   Occupational History  . Not on file.   Social History Main Topics  . Smoking status: Never Smoker   . Smokeless tobacco: Never Used  . Alcohol  Use: No  . Drug Use: No  . Sexual Activity: Not on file   Other Topics Concern  . Not on file   Social History Narrative  . No narrative on file    Past Surgical History  Procedure Laterality Date  . Tonsillectomy      childhood 1932  . Cataract extraction  08/2007    History reviewed. No pertinent family history.  No Known Allergies  Current Outpatient Prescriptions on File Prior to Visit  Medication Sig Dispense Refill  . aspirin 81 MG tablet Take 81 mg by mouth every other day.      . calcium-vitamin D 250-100 MG-UNIT per tablet Take 1 tablet by mouth daily.        .  Cholecalciferol (VITAMIN D) 1000 UNITS capsule Take 1,000 Units by mouth daily.        . Multiple Vitamins-Minerals (CENTRUM SILVER PO) Take 1 tablet by mouth daily.        . travoprost, benzalkonium, (TRAVATAN) 0.004 % ophthalmic solution Apply 1 drop to eye at bedtime.        . vitamin C (ASCORBIC ACID) 500 MG tablet Take 500 mg by mouth daily.         No current facility-administered medications on file prior to visit.    There were no vitals taken for this visit.      Review of Systems  Constitutional: Negative for fever, appetite change, fatigue and unexpected weight change.  HENT: Negative for congestion, dental problem, ear pain, hearing loss, mouth sores, nosebleeds, sinus pressure, sore throat, tinnitus, trouble swallowing and voice change.   Eyes: Negative for photophobia, pain, redness and visual disturbance.  Respiratory: Negative for cough, chest tightness and shortness of breath.   Cardiovascular: Negative for chest pain, palpitations and leg swelling.  Gastrointestinal: Negative for nausea, vomiting, abdominal pain, diarrhea, constipation, blood in stool, abdominal distention and rectal pain.  Genitourinary: Negative for dysuria, urgency, frequency, hematuria, flank pain, vaginal bleeding, vaginal discharge, difficulty urinating, genital sores, vaginal pain, menstrual problem and pelvic pain.  Musculoskeletal: Negative for arthralgias, back pain and neck stiffness.  Skin: Negative for rash.  Neurological: Negative for dizziness, syncope, speech difficulty, weakness, light-headedness, numbness and headaches.  Hematological: Negative for adenopathy. Does not bruise/bleed easily.  Psychiatric/Behavioral: Negative for suicidal ideas, behavioral problems, self-injury, dysphoric mood and agitation. The patient is not nervous/anxious.        Objective:   Physical Exam  Constitutional: She is oriented to person, place, and time. She appears well-developed and well-nourished.   HENT:  Head: Normocephalic and atraumatic.  Right Ear: External ear normal.  Left Ear: External ear normal.  Mouth/Throat: Oropharynx is clear and moist.  Eyes: Conjunctivae and EOM are normal.  Neck: Normal range of motion. Neck supple. No JVD present. No thyromegaly present.  Bilateral carotid bruits  Cardiovascular: Normal rate, regular rhythm, normal heart sounds and intact distal pulses.   No murmur heard. Pedal pulses intact, except for a nonpalpable right dorsalis pedis pulse  Pulmonary/Chest: Effort normal and breath sounds normal. She has no wheezes. She has no rales.  Kyphosis  Abdominal: Soft. Bowel sounds are normal. She exhibits no distension and no mass. There is no tenderness. There is no rebound and no guarding.  Musculoskeletal: Normal range of motion. She exhibits no edema and no tenderness.  Neurological: She is alert and oriented to person, place, and time. She has normal reflexes. No cranial nerve deficit. She exhibits normal muscle tone. Coordination normal.  Skin: Skin  is warm and dry. No rash noted.  Psychiatric: She has a normal mood and affect. Her behavior is normal.          Assessment & Plan:   Annual health examination Osteoporosis  bilateral carotid bruits.  Statin therapy will be initiated.  Continue on daily low-dose aspirin  A bone density study will be reviewed. Daily aspirin 81 mg daily recommended.  We'll consider prolia if there is significant progression of osteoporosis  Return in one year for followup  Followup carotid artery Doppler will be obtained Recheck 1 year

## 2013-07-08 NOTE — Progress Notes (Signed)
Pre-visit discussion using our clinic review tool. No additional management support is needed unless otherwise documented below in the visit note.  

## 2013-07-08 NOTE — Patient Instructions (Signed)
Take a calcium supplement, plus 800-1200 units of vitamin D    It is important that you exercise regularly, at least 20 minutes 3 to 4 times per week.  If you develop chest pain or shortness of breath seek  medical attention.  Return in one year for follow-up   

## 2013-07-15 ENCOUNTER — Ambulatory Visit (HOSPITAL_COMMUNITY): Payer: Medicare Other | Attending: Cardiology | Admitting: *Deleted

## 2013-07-15 DIAGNOSIS — R0989 Other specified symptoms and signs involving the circulatory and respiratory systems: Secondary | ICD-10-CM

## 2013-07-15 DIAGNOSIS — I6529 Occlusion and stenosis of unspecified carotid artery: Secondary | ICD-10-CM

## 2013-07-15 DIAGNOSIS — I658 Occlusion and stenosis of other precerebral arteries: Secondary | ICD-10-CM | POA: Insufficient documentation

## 2013-07-15 DIAGNOSIS — E785 Hyperlipidemia, unspecified: Secondary | ICD-10-CM | POA: Diagnosis not present

## 2013-07-15 NOTE — Progress Notes (Signed)
Carotid duplex complete 

## 2013-09-16 ENCOUNTER — Ambulatory Visit (HOSPITAL_COMMUNITY)
Admission: RE | Admit: 2013-09-16 | Discharge: 2013-09-16 | Disposition: A | Discharge status: Home / Self Care | Payer: Medicare Other | Source: Ambulatory Visit | Attending: Internal Medicine | Admitting: Internal Medicine

## 2013-09-16 DIAGNOSIS — Z13820 Encounter for screening for osteoporosis: Secondary | ICD-10-CM | POA: Insufficient documentation

## 2013-09-16 DIAGNOSIS — M810 Age-related osteoporosis without current pathological fracture: Secondary | ICD-10-CM

## 2013-09-16 DIAGNOSIS — Z780 Asymptomatic menopausal state: Secondary | ICD-10-CM | POA: Diagnosis not present

## 2013-09-22 DIAGNOSIS — H4010X0 Unspecified open-angle glaucoma, stage unspecified: Secondary | ICD-10-CM | POA: Diagnosis not present

## 2013-09-22 DIAGNOSIS — H409 Unspecified glaucoma: Secondary | ICD-10-CM | POA: Diagnosis not present

## 2013-10-26 ENCOUNTER — Telehealth: Payer: Self-pay | Admitting: Internal Medicine

## 2013-10-26 MED ORDER — CALCIUM CITRATE-VITAMIN D 315-250 MG-UNIT PO TABS: 2.0000 | ORAL_TABLET | Freq: Two times a day (BID) | ORAL | Status: DC

## 2013-10-26 NOTE — Telephone Encounter (Signed)
Pt would like to tell you something before she comes in tomorrow.  Pt states its unusual for her to come in except for a cpe and wants to tell you why. Pt refused to elaborate.

## 2013-10-26 NOTE — Telephone Encounter (Signed)
Spoke to pt wanted to discuss why coming in tomorrow. Pt experiencing legs getting very tired and fatigue, and irregular heartbeat episodes, none since Sat. Pt also taking extra Calcium and Vit d along with Vit D3, pt not sure if this is causing her legs to tire will discuss at visit tomorrow. Told pt okay will see her tomorrow.

## 2013-10-27 ENCOUNTER — Encounter: Payer: Self-pay | Admitting: Internal Medicine

## 2013-10-27 ENCOUNTER — Ambulatory Visit (INDEPENDENT_AMBULATORY_CARE_PROVIDER_SITE_OTHER): Payer: Medicare Other | Admitting: Internal Medicine

## 2013-10-27 VITALS — BP 126/70 | HR 66 | Temp 98.0°F | Resp 18 | Ht 60.5 in | Wt 131.0 lb

## 2013-10-27 DIAGNOSIS — I6529 Occlusion and stenosis of unspecified carotid artery: Secondary | ICD-10-CM | POA: Diagnosis not present

## 2013-10-27 DIAGNOSIS — E785 Hyperlipidemia, unspecified: Secondary | ICD-10-CM

## 2013-10-27 DIAGNOSIS — R5381 Other malaise: Secondary | ICD-10-CM | POA: Diagnosis not present

## 2013-10-27 DIAGNOSIS — R5383 Other fatigue: Secondary | ICD-10-CM | POA: Diagnosis not present

## 2013-10-27 DIAGNOSIS — I499 Cardiac arrhythmia, unspecified: Secondary | ICD-10-CM

## 2013-10-27 LAB — CBC WITH DIFFERENTIAL/PLATELET
BASOS PCT: 0.8 % (ref 0.0–3.0)
Basophils Absolute: 0 10*3/uL (ref 0.0–0.1)
EOS ABS: 0.1 10*3/uL (ref 0.0–0.7)
EOS PCT: 1.7 % (ref 0.0–5.0)
HCT: 39.7 % (ref 36.0–46.0)
Hemoglobin: 13.1 g/dL (ref 12.0–15.0)
LYMPHS PCT: 39.4 % (ref 12.0–46.0)
Lymphs Abs: 1.9 10*3/uL (ref 0.7–4.0)
MCHC: 33 g/dL (ref 30.0–36.0)
MCV: 92.8 fl (ref 78.0–100.0)
Monocytes Absolute: 0.5 10*3/uL (ref 0.1–1.0)
Monocytes Relative: 9.6 % (ref 3.0–12.0)
NEUTROS PCT: 48.5 % (ref 43.0–77.0)
Neutro Abs: 2.3 10*3/uL (ref 1.4–7.7)
Platelets: 274 10*3/uL (ref 150.0–400.0)
RBC: 4.28 Mil/uL (ref 3.87–5.11)
RDW: 13.5 % (ref 11.5–15.5)
WBC: 4.7 10*3/uL (ref 4.0–10.5)

## 2013-10-27 LAB — SEDIMENTATION RATE: Sed Rate: 16 mm/hr (ref 0–22)

## 2013-10-27 NOTE — Progress Notes (Signed)
Subjective:    Patient ID: Tina Schroeder, female    DOB: 1923-07-23, 80 y.o.   MRN: [id number]  HPI  80 year old patient who presents with a 3 to four-week history of weakness in her legs.  She states that she was visiting [facility] and doing a considerable amount of walking and first noticed some weakness in her legs.  She feels this has persisted since her return to [place].  She does well in the house when she has limited ambulation and is able to sit for a period of rest.  Denies claudication.\ She does have a history of dyslipidemia and is on statin therapy She also has had 2 episodes of brief palpitations.  Laboratory screen 4 months ago, unremarkable  Past Medical History  Diagnosis Date  . OP (osteoporosis)   . Glaucoma   . Carotid bruit     right    History   Social History  . Marital Status: Widowed    Spouse Name: N/A    Number of Children: N/A  . Years of Education: N/A   Occupational History  . Not on file.   Social History Main Topics  . Smoking status: Never Smoker   . Smokeless tobacco: Never Used  . Alcohol Use: No  . Drug Use: No  . Sexual Activity: Not on file   Other Topics Concern  . Not on file   Social History Narrative  . No narrative on file    Past Surgical History  Procedure Laterality Date  . Tonsillectomy      childhood 1932  . Cataract extraction  08/2007    History reviewed. No pertinent family history.  Allergies  Allergen Reactions  . Quinine Derivatives Palpitations    Nausea, weak    Current Outpatient Prescriptions on File Prior to Visit  Medication Sig Dispense Refill  . aspirin 81 MG tablet Take 81 mg by mouth daily.       . Calcium Citrate-Vitamin D (CALCIUM CITRATE + D3 MAXIMUM) 315-250 MG-UNIT TABS Take 2 tablets by mouth 2 (two) times daily.  120 tablet    . Cholecalciferol (VITAMIN D) 1000 UNITS capsule Take 1,000 Units by mouth daily.        . dorzolamide-timolol (COSOPT) 22.3-6.8 MG/ML ophthalmic  solution Place 1 drop into both eyes 2 (two) times daily.       . Multiple Vitamins-Minerals (CENTRUM SILVER PO) Take 1 tablet by mouth daily.        . pravastatin (PRAVACHOL) 20 MG tablet Take 1 tablet (20 mg total) by mouth daily.  90 tablet  3   No current facility-administered medications on file prior to visit.    BP 126/70  Pulse 66  Temp(Src) 98 F (36.7 C) (Oral)  Resp 18  Ht 5' 0.5" (1.537 m)  Wt 131 lb (59.421 kg)  BMI 25.15 kg/m2  SpO2 97%      Review of Systems  HENT: Negative for congestion, dental problem, hearing loss, rhinorrhea, sinus pressure, sore throat and tinnitus.   Eyes: Negative for pain, discharge and visual disturbance.  Respiratory: Negative for cough and shortness of breath.   Cardiovascular: Negative for chest pain, palpitations and leg swelling.  Gastrointestinal: Negative for nausea, vomiting, abdominal pain, diarrhea, constipation, blood in stool and abdominal distention.  Genitourinary: Negative for dysuria, urgency, frequency, hematuria, flank pain, vaginal bleeding, vaginal discharge, difficulty urinating, vaginal pain and pelvic pain.  Musculoskeletal: Negative for arthralgias, gait problem and joint swelling.  Skin: Negative for rash.  Neurological: Positive for weakness. Negative for dizziness, syncope, speech difficulty, numbness and headaches.  Hematological: Negative for adenopathy.  Psychiatric/Behavioral: Negative for behavioral problems, dysphoric mood and agitation. The patient is not nervous/anxious.        Objective:   Physical Exam  Constitutional: She is oriented to person, place, and time. She appears well-developed and well-nourished.  Blood pressure 120/80  HENT:  Head: Normocephalic.  Right Ear: External ear normal.  Left Ear: External ear normal.  Mouth/Throat: Oropharynx is clear and moist.  Eyes: Conjunctivae and EOM are normal. Pupils are equal, round, and reactive to light.  Neck: Normal range of motion. Neck  supple. No thyromegaly present.  A right carotid bruit not appreciated today  Cardiovascular: Normal rate, regular rhythm, normal heart sounds and intact distal pulses.   No murmur heard. Dorsalis pedis pulses faint.  Posterior tibial pulses.  Full  Pulmonary/Chest: Effort normal and breath sounds normal.  Kyphosis  Abdominal: Soft. Bowel sounds are normal. She exhibits no mass. There is no tenderness.  Musculoskeletal: Normal range of motion.  Lymphadenopathy:    She has no cervical adenopathy.  Neurological: She is alert and oriented to person, place, and time.  Reflexes brisk and equal Normal gait Able to stand easily from a sitting position and transferred to the examining table without difficulty ( placed right foot on elevated step and transferred entire weight without use of hands)  Skin: Skin is warm and dry. No rash noted.  Psychiatric: She has a normal mood and affect. Her behavior is normal.          Assessment & Plan:   Leg weakness.  Unremarkable exam.  Will hold statin therapy check CPK and sedimentation rate, CBC, and observe Dyslipidemia

## 2013-10-27 NOTE — Progress Notes (Signed)
Pre visit review using our clinic review tool, if applicable. No additional management support is needed unless otherwise documented below in the visit note. 

## 2013-10-27 NOTE — Patient Instructions (Signed)
Hold pravastatin for one month  Call or return to clinic prn if these symptoms worsen or fail to improve as anticipated.

## 2013-10-28 LAB — CK TOTAL AND CKMB (NOT AT ARMC)
CK TOTAL: 61 U/L (ref 7–177)
CK, MB: 3 ng/mL (ref 0.0–5.0)

## 2013-11-30 ENCOUNTER — Telehealth: Payer: Self-pay | Admitting: Internal Medicine

## 2013-11-30 NOTE — Telephone Encounter (Signed)
Pt has been off pravastatin (PRAVACHOL) 20 MG tablet For a month and want to let you know she is feeling fine!! (instructed to let you know)

## 2013-11-30 NOTE — Telephone Encounter (Signed)
FYI

## 2013-12-31 DIAGNOSIS — Z23 Encounter for immunization: Secondary | ICD-10-CM | POA: Diagnosis not present

## 2014-03-23 ENCOUNTER — Other Ambulatory Visit: Payer: Self-pay | Admitting: Internal Medicine

## 2014-03-23 DIAGNOSIS — Z1231 Encounter for screening mammogram for malignant neoplasm of breast: Secondary | ICD-10-CM

## 2014-04-01 ENCOUNTER — Ambulatory Visit (HOSPITAL_COMMUNITY): Payer: Medicare Other

## 2014-04-06 ENCOUNTER — Ambulatory Visit (HOSPITAL_COMMUNITY): Payer: Medicare Other

## 2014-05-18 DIAGNOSIS — H02839 Dermatochalasis of unspecified eye, unspecified eyelid: Secondary | ICD-10-CM | POA: Diagnosis not present

## 2014-05-18 DIAGNOSIS — H4010X1 Unspecified open-angle glaucoma, mild stage: Secondary | ICD-10-CM | POA: Diagnosis not present

## 2014-05-18 DIAGNOSIS — H18412 Arcus senilis, left eye: Secondary | ICD-10-CM | POA: Diagnosis not present

## 2014-05-18 DIAGNOSIS — Z961 Presence of intraocular lens: Secondary | ICD-10-CM | POA: Diagnosis not present

## 2014-05-18 DIAGNOSIS — H18411 Arcus senilis, right eye: Secondary | ICD-10-CM | POA: Diagnosis not present

## 2014-05-20 ENCOUNTER — Ambulatory Visit (HOSPITAL_COMMUNITY)
Admission: RE | Admit: 2014-05-20 | Discharge: 2014-05-20 | Disposition: A | Discharge status: Home / Self Care | Payer: Medicare Other | Source: Ambulatory Visit | Attending: Internal Medicine | Admitting: Internal Medicine

## 2014-05-20 DIAGNOSIS — Z1231 Encounter for screening mammogram for malignant neoplasm of breast: Secondary | ICD-10-CM

## 2014-09-21 DIAGNOSIS — H26492 Other secondary cataract, left eye: Secondary | ICD-10-CM | POA: Diagnosis not present

## 2014-09-21 DIAGNOSIS — H02839 Dermatochalasis of unspecified eye, unspecified eyelid: Secondary | ICD-10-CM | POA: Diagnosis not present

## 2014-09-21 DIAGNOSIS — H18411 Arcus senilis, right eye: Secondary | ICD-10-CM | POA: Diagnosis not present

## 2014-09-21 DIAGNOSIS — Z961 Presence of intraocular lens: Secondary | ICD-10-CM | POA: Diagnosis not present

## 2014-09-21 DIAGNOSIS — H18412 Arcus senilis, left eye: Secondary | ICD-10-CM | POA: Diagnosis not present

## 2014-10-22 DIAGNOSIS — H04121 Dry eye syndrome of right lacrimal gland: Secondary | ICD-10-CM | POA: Diagnosis not present

## 2014-10-22 DIAGNOSIS — H04122 Dry eye syndrome of left lacrimal gland: Secondary | ICD-10-CM | POA: Diagnosis not present

## 2014-11-20 DIAGNOSIS — Z23 Encounter for immunization: Secondary | ICD-10-CM | POA: Diagnosis not present

## 2014-11-23 DIAGNOSIS — H4010X1 Unspecified open-angle glaucoma, mild stage: Secondary | ICD-10-CM | POA: Diagnosis not present

## 2014-11-23 DIAGNOSIS — Z961 Presence of intraocular lens: Secondary | ICD-10-CM | POA: Diagnosis not present

## 2014-11-23 DIAGNOSIS — H02839 Dermatochalasis of unspecified eye, unspecified eyelid: Secondary | ICD-10-CM | POA: Diagnosis not present

## 2014-11-23 DIAGNOSIS — H18411 Arcus senilis, right eye: Secondary | ICD-10-CM | POA: Diagnosis not present

## 2014-11-23 DIAGNOSIS — H524 Presbyopia: Secondary | ICD-10-CM | POA: Diagnosis not present

## 2014-12-07 ENCOUNTER — Ambulatory Visit (INDEPENDENT_AMBULATORY_CARE_PROVIDER_SITE_OTHER): Payer: Medicare Other | Admitting: Internal Medicine

## 2014-12-07 ENCOUNTER — Encounter: Payer: Self-pay | Admitting: Internal Medicine

## 2014-12-07 VITALS — BP 140/80 | HR 69 | Temp 97.7°F | Resp 20 | Ht 60.5 in | Wt 128.0 lb

## 2014-12-07 DIAGNOSIS — R0989 Other specified symptoms and signs involving the circulatory and respiratory systems: Secondary | ICD-10-CM | POA: Diagnosis not present

## 2014-12-07 DIAGNOSIS — M810 Age-related osteoporosis without current pathological fracture: Secondary | ICD-10-CM | POA: Diagnosis not present

## 2014-12-07 DIAGNOSIS — E785 Hyperlipidemia, unspecified: Secondary | ICD-10-CM | POA: Diagnosis not present

## 2014-12-07 DIAGNOSIS — Z0000 Encounter for general adult medical examination without abnormal findings: Secondary | ICD-10-CM | POA: Diagnosis not present

## 2014-12-07 LAB — CBC WITH DIFFERENTIAL/PLATELET
BASOS PCT: 0.8 % (ref 0.0–3.0)
Basophils Absolute: 0 10*3/uL (ref 0.0–0.1)
Eosinophils Absolute: 0.1 10*3/uL (ref 0.0–0.7)
Eosinophils Relative: 1.4 % (ref 0.0–5.0)
HEMATOCRIT: 40.3 % (ref 36.0–46.0)
Hemoglobin: 13.3 g/dL (ref 12.0–15.0)
LYMPHS PCT: 31.2 % (ref 12.0–46.0)
Lymphs Abs: 1.6 10*3/uL (ref 0.7–4.0)
MCHC: 32.9 g/dL (ref 30.0–36.0)
MCV: 93.1 fl (ref 78.0–100.0)
Monocytes Absolute: 0.4 10*3/uL (ref 0.1–1.0)
Monocytes Relative: 7.3 % (ref 3.0–12.0)
NEUTROS ABS: 3.1 10*3/uL (ref 1.4–7.7)
Neutrophils Relative %: 59.3 % (ref 43.0–77.0)
Platelets: 254 10*3/uL (ref 150.0–400.0)
RBC: 4.33 Mil/uL (ref 3.87–5.11)
RDW: 13.7 % (ref 11.5–15.5)
WBC: 5.2 10*3/uL (ref 4.0–10.5)

## 2014-12-07 LAB — LIPID PANEL
CHOL/HDL RATIO: 3
Cholesterol: 195 mg/dL (ref 0–200)
HDL: 66.5 mg/dL (ref >39.00)
LDL CALC: 111 mg/dL — AB (ref 0–99)
NONHDL: 128.28
Triglycerides: 87 mg/dL (ref 0.0–149.0)
VLDL: 17.4 mg/dL (ref 0.0–40.0)

## 2014-12-07 LAB — COMPREHENSIVE METABOLIC PANEL
ALT: 16 U/L (ref 0–35)
AST: 23 U/L (ref 0–37)
Albumin: 4 g/dL (ref 3.5–5.2)
Alkaline Phosphatase: 64 U/L (ref 39–117)
BUN: 21 mg/dL (ref 6–23)
CHLORIDE: 106 meq/L (ref 96–112)
CO2: 29 meq/L (ref 19–32)
CREATININE: 0.9 mg/dL (ref 0.40–1.20)
Calcium: 10.1 mg/dL (ref 8.4–10.5)
GFR: 62.59 mL/min (ref >60.00)
GLUCOSE: 101 mg/dL — AB (ref 70–99)
Potassium: 4.8 mEq/L (ref 3.5–5.1)
SODIUM: 142 meq/L (ref 135–145)
Total Bilirubin: 0.6 mg/dL (ref 0.2–1.2)
Total Protein: 6.9 g/dL (ref 6.0–8.3)

## 2014-12-07 LAB — TSH: TSH: 1.37 u[IU]/mL (ref 0.35–4.50)

## 2014-12-07 NOTE — Progress Notes (Signed)
Pre visit review using our clinic review tool, if applicable. No additional management support is needed unless otherwise documented below in the visit note. 

## 2014-12-07 NOTE — Patient Instructions (Signed)
Limit your sodium (Salt) intake    It is important that you exercise regularly, at least 20 minutes 3 to 4 times per week.  If you develop chest pain or shortness of breath seek  medical attention.  Take a calcium supplement, plus [phone] units of vitamin D  Please see your eye doctor yearly to check for  eye damage  Return in one year for follow-up

## 2014-12-07 NOTE — Progress Notes (Signed)
Patient ID: Tina Schroeder, female   DOB: Aug 07, 1923, 81 y.o.   MRN: [id number]  Subjective:    Patient ID: Tina Schroeder, female    DOB: [birth year], 81 y.o.   MRN: [id number]  HPI 81 year-old patient who is seen today for an annual health exam.  She has a history of a right carotid bruit which has been asymptomatic. She also has been treated for osteoporosis;  Fosamax therapy was discontinued approximately 5 years ago.   She had been on this medication in excess of 5 years. She remains on calcium and vitamin D supplements. She is asymptomatic. Denies any focal neurological symptoms.  Followup bone density study approximately 2015  revealed no  progression but still fairly mild osteoporosis (-2.7).  She does complain of loss of height and no pain.  Denies any focal neurological symptoms  carotid artery Doppler ultrasound 2015, also stable.  Follow-up in 2 years recommended     Current Allergies:  No known allergies   Past Medical History:  Osteoporosis  glaucoma   Past Surgical History:  childhood tonsillectomy, 1932  gravida two, para two, abortus zero  Status post cataract extraction surgery  Family History:  father died age 60 Mother died at age 69, leukemia  no siblings   Social History:  relocate her from the [place] daughter lives in [place], who is a Programmer, applications. at [employer] Factors:  Tobacco use: never    Past Medical History  Diagnosis Date  . OP (osteoporosis)   . Glaucoma   . Carotid bruit     right   Past Surgical History  Procedure Laterality Date  . Tonsillectomy      childhood 1932  . Cataract extraction  08/2007    reports that she has never smoked. She has never used smokeless tobacco. She reports that she does not drink alcohol or use illicit drugs. family history is not on file. Allergies  Allergen Reactions  . Quinine Derivatives Palpitations    Nausea, weak     1. Risk factors, based on past  M,S,F history-  cardiovascular risk factors  include age. She has a history of a right carotid bruit 2.  Physical activities: Remains quite active and independent no exercise limitations  3.  Depression/mood: History depression or mood disorder  4.  Hearing: No deficits  5.  ADL's: Independent in all aspects of daily living  6.  Fall risk: Low  7.  Home safety: No problems identified  8.  Height weight, and visual acuity; height and weight stable she is followed by ophthalmology for glaucoma. She has a history of bilateral cataract extraction surgery  9.  Counseling: Heart healthy diet active lifestyle all encouraged baby aspirin 81 mg daily recommended  10. Lab orders based on risk factors: Laboratory profile will be reviewed  11. Referral: Followup ophthalmology.  12. Care plan: Aspirin 81 mg daily she'll continue calcium and vitamin D supplementation. A followup bone density study will be obtained and we'll consider further treatment of osteoporosis if indicated  13. Cognitive assessment: Alert and oriented normal affect. No cognitive dysfunction  14.  Preventive services will include annual clinical examinations with screening lab.  The patient continue to have mammograms every 1-2 years.  She is seen by ophthalmology annually.  We'll continue to monitor for carotid artery stenosis and osteoporosis.    15.  Provider list includes primary care ophthalmology  Past Medical History  Diagnosis Date  . OP (osteoporosis)   . Glaucoma   .  Carotid bruit     right    Social History   Social History  . Marital Status: Widowed    Spouse Name: N/A  . Number of Children: N/A  . Years of Education: N/A   Occupational History  . Not on file.   Social History Main Topics  . Smoking status: Never Smoker   . Smokeless tobacco: Never Used  . Alcohol Use: No  . Drug Use: No  . Sexual Activity: Not on file   Other Topics Concern  . Not on file   Social History Narrative  . No narrative on file    Past Surgical History   Procedure Laterality Date  . Tonsillectomy      childhood 1932  . Cataract extraction  08/2007    No family history on file.  Allergies  Allergen Reactions  . Quinine Derivatives Palpitations    Nausea, weak    Current Outpatient Prescriptions on File Prior to Visit  Medication Sig Dispense Refill  . aspirin 81 MG tablet Take 81 mg by mouth daily.     . Calcium Citrate-Vitamin D (CALCIUM CITRATE + D3 MAXIMUM) 315-250 MG-UNIT TABS Take 2 tablets by mouth 2 (two) times daily. 120 tablet   . Cholecalciferol (VITAMIN D) 1000 UNITS capsule Take 1,000 Units by mouth daily.      . dorzolamide-timolol (COSOPT) 22.3-6.8 MG/ML ophthalmic solution Place 1 drop into both eyes 2 (two) times daily.     . Multiple Vitamins-Minerals (CENTRUM SILVER PO) Take 1 tablet by mouth daily.       No current facility-administered medications on file prior to visit.    There were no vitals taken for this visit.      Review of Systems  Constitutional: Negative for fever, appetite change, fatigue and unexpected weight change.  HENT: Negative for congestion, dental problem, ear pain, hearing loss, mouth sores, nosebleeds, sinus pressure, sore throat, tinnitus, trouble swallowing and voice change.   Eyes: Negative for photophobia, pain, redness and visual disturbance.  Respiratory: Negative for cough, chest tightness and shortness of breath.   Cardiovascular: Negative for chest pain, palpitations and leg swelling.  Gastrointestinal: Negative for nausea, vomiting, abdominal pain, diarrhea, constipation, blood in stool, abdominal distention and rectal pain.  Genitourinary: Negative for dysuria, urgency, frequency, hematuria, flank pain, vaginal bleeding, vaginal discharge, difficulty urinating, genital sores, vaginal pain, menstrual problem and pelvic pain.  Musculoskeletal: Negative for back pain, arthralgias and neck stiffness.  Skin: Negative for rash.  Neurological: Negative for dizziness, syncope,  speech difficulty, weakness, light-headedness, numbness and headaches.  Hematological: Negative for adenopathy. Does not bruise/bleed easily.  Psychiatric/Behavioral: Negative for suicidal ideas, behavioral problems, self-injury, dysphoric mood and agitation. The patient is not nervous/anxious.        Objective:   Physical Exam  Constitutional: She is oriented to person, place, and time. She appears well-developed and well-nourished.  HENT:  Head: Normocephalic and atraumatic.  Right Ear: External ear normal.  Left Ear: External ear normal.  Mouth/Throat: Oropharynx is clear and moist.  Eyes: Conjunctivae and EOM are normal.  Neck: Normal range of motion. Neck supple. No JVD present. No thyromegaly present.  Bilateral carotid bruits not noted today  Cardiovascular: Normal rate, regular rhythm, normal heart sounds and intact distal pulses.   No murmur heard. Dorsalis pedis pulses not easily palpable.  Posterior tibial pulses intact  Pulmonary/Chest: Effort normal and breath sounds normal. She has no wheezes. She has no rales.  Kyphosis  Abdominal: Soft. Bowel sounds are normal.  She exhibits no distension and no mass. There is no tenderness. There is no rebound and no guarding.  Musculoskeletal: Normal range of motion. She exhibits no edema or tenderness.  Neurological: She is alert and oriented to person, place, and time. She has normal reflexes. No cranial nerve deficit. She exhibits normal muscle tone. Coordination normal.  Skin: Skin is warm and dry. No rash noted.  Psychiatric: She has a normal mood and affect. Her behavior is normal.          Assessment & Plan:   Annual health examination Osteoporosis.  Status post treatment with Fosamax.  Follow-up bone density last year revealed no progression.  Will recheck bone density in one year Bilateral carotid bruits.  Statin therapy  initiated.  Continue on daily low-dose aspirin.  Follow carotid artery Doppler ultrasound in one  year   Recheck 1 year Check screening lab Annual eye examination

## 2015-04-06 ENCOUNTER — Encounter: Payer: Self-pay | Admitting: Internal Medicine

## 2015-05-24 DIAGNOSIS — H401131 Primary open-angle glaucoma, bilateral, mild stage: Secondary | ICD-10-CM | POA: Diagnosis not present

## 2015-05-24 DIAGNOSIS — H02839 Dermatochalasis of unspecified eye, unspecified eyelid: Secondary | ICD-10-CM | POA: Diagnosis not present

## 2015-05-24 DIAGNOSIS — H524 Presbyopia: Secondary | ICD-10-CM | POA: Diagnosis not present

## 2015-05-24 DIAGNOSIS — Z961 Presence of intraocular lens: Secondary | ICD-10-CM | POA: Diagnosis not present

## 2015-07-18 ENCOUNTER — Other Ambulatory Visit: Payer: Self-pay

## 2015-07-18 DIAGNOSIS — Z1231 Encounter for screening mammogram for malignant neoplasm of breast: Secondary | ICD-10-CM

## 2015-08-02 ENCOUNTER — Ambulatory Visit
Admission: RE | Admit: 2015-08-02 | Discharge: 2015-08-02 | Disposition: A | Discharge status: Home / Self Care | Payer: Medicare Other | Source: Ambulatory Visit

## 2015-08-02 DIAGNOSIS — Z1231 Encounter for screening mammogram for malignant neoplasm of breast: Secondary | ICD-10-CM

## 2015-11-29 DIAGNOSIS — H02839 Dermatochalasis of unspecified eye, unspecified eyelid: Secondary | ICD-10-CM | POA: Diagnosis not present

## 2015-11-29 DIAGNOSIS — H18413 Arcus senilis, bilateral: Secondary | ICD-10-CM | POA: Diagnosis not present

## 2015-11-29 DIAGNOSIS — H401131 Primary open-angle glaucoma, bilateral, mild stage: Secondary | ICD-10-CM | POA: Diagnosis not present

## 2015-11-29 DIAGNOSIS — Z961 Presence of intraocular lens: Secondary | ICD-10-CM | POA: Diagnosis not present

## 2015-12-09 DIAGNOSIS — Z23 Encounter for immunization: Secondary | ICD-10-CM | POA: Diagnosis not present

## 2016-01-10 ENCOUNTER — Encounter: Payer: Self-pay | Admitting: Internal Medicine

## 2016-01-10 ENCOUNTER — Ambulatory Visit (INDEPENDENT_AMBULATORY_CARE_PROVIDER_SITE_OTHER): Payer: Medicare Other | Admitting: Internal Medicine

## 2016-01-10 VITALS — BP 130/70 | HR 60 | Temp 97.8°F | Resp 20 | Ht 60.75 in | Wt 126.0 lb

## 2016-01-10 DIAGNOSIS — I6529 Occlusion and stenosis of unspecified carotid artery: Secondary | ICD-10-CM

## 2016-01-10 DIAGNOSIS — Z8679 Personal history of other diseases of the circulatory system: Secondary | ICD-10-CM | POA: Diagnosis not present

## 2016-01-10 DIAGNOSIS — D239 Other benign neoplasm of skin, unspecified: Secondary | ICD-10-CM

## 2016-01-10 DIAGNOSIS — E785 Hyperlipidemia, unspecified: Secondary | ICD-10-CM | POA: Diagnosis not present

## 2016-01-10 DIAGNOSIS — Z0000 Encounter for general adult medical examination without abnormal findings: Secondary | ICD-10-CM

## 2016-01-10 DIAGNOSIS — D6489 Other specified anemias: Secondary | ICD-10-CM | POA: Diagnosis not present

## 2016-01-10 DIAGNOSIS — M818 Other osteoporosis without current pathological fracture: Secondary | ICD-10-CM

## 2016-01-10 LAB — COMPREHENSIVE METABOLIC PANEL
ALBUMIN: 4.2 g/dL (ref 3.5–5.2)
ALK PHOS: 67 U/L (ref 39–117)
ALT: 15 U/L (ref 0–35)
AST: 22 U/L (ref 0–37)
BUN: 23 mg/dL (ref 6–23)
CO2: 27 mEq/L (ref 19–32)
Calcium: 9.9 mg/dL (ref 8.4–10.5)
Chloride: 105 mEq/L (ref 96–112)
Creatinine, Ser: 0.78 mg/dL (ref 0.40–1.20)
GFR: 73.65 mL/min (ref >60.00)
Glucose, Bld: 98 mg/dL (ref 70–99)
Potassium: 4 mEq/L (ref 3.5–5.1)
SODIUM: 141 meq/L (ref 135–145)
Total Bilirubin: 0.8 mg/dL (ref 0.2–1.2)
Total Protein: 6.8 g/dL (ref 6.0–8.3)

## 2016-01-10 LAB — CBC WITH DIFFERENTIAL/PLATELET
BASOS ABS: 0 10*3/uL (ref 0.0–0.1)
Basophils Relative: 0.8 % (ref 0.0–3.0)
EOS PCT: 1.9 % (ref 0.0–5.0)
Eosinophils Absolute: 0.1 10*3/uL (ref 0.0–0.7)
HCT: 39 % (ref 36.0–46.0)
Hemoglobin: 13.1 g/dL (ref 12.0–15.0)
Lymphocytes Relative: 32.6 % (ref 12.0–46.0)
Lymphs Abs: 1.5 10*3/uL (ref 0.7–4.0)
MCHC: 33.7 g/dL (ref 30.0–36.0)
MCV: 90.9 fl (ref 78.0–100.0)
MONO ABS: 0.4 10*3/uL (ref 0.1–1.0)
Monocytes Relative: 7.7 % (ref 3.0–12.0)
NEUTROS PCT: 57 % (ref 43.0–77.0)
Neutro Abs: 2.6 10*3/uL (ref 1.4–7.7)
Platelets: 259 10*3/uL (ref 150.0–400.0)
RBC: 4.29 Mil/uL (ref 3.87–5.11)
RDW: 14.3 % (ref 11.5–15.5)
WBC: 4.6 10*3/uL (ref 4.0–10.5)

## 2016-01-10 LAB — LIPID PANEL
CHOLESTEROL: 208 mg/dL — AB (ref 0–200)
HDL: 74.9 mg/dL (ref >39.00)
LDL Cholesterol: 114 mg/dL — ABNORMAL HIGH (ref 0–99)
NonHDL: 132.79
Total CHOL/HDL Ratio: 3
Triglycerides: 92 mg/dL (ref 0.0–149.0)
VLDL: 18.4 mg/dL (ref 0.0–40.0)

## 2016-01-10 LAB — TSH: TSH: 1.84 u[IU]/mL (ref 0.35–4.50)

## 2016-01-10 NOTE — Patient Instructions (Signed)
Limit your sodium (Salt) intake  Take a calcium supplement, plus [phone] units of vitamin D    It is important that you exercise regularly, at least 20 minutes 3 to 4 times per week.  If you develop chest pain or shortness of breath seek  medical attention.  Carotid artery Doppler ultrasound as discussed  Return in one year for follow-up

## 2016-01-10 NOTE — Progress Notes (Signed)
Patient ID: Tina Schroeder, female   DOB: 03-25-1923, 82 y.o.   MRN: [id number]  Subjective:    Patient ID: Tina Schroeder, female    DOB: 1923/11/23, 82 y.o.   MRN: [id number]  HPI 82 year-old patient who is seen today for an annual health exam.  She has a history of a right carotid bruit which has been asymptomatic. Statin therapy was discussed last year, but she states that this was not tolerated well in the past, and therapy was not initiated.  No focal neurological symptoms  She also has been treated for osteoporosis;  Fosamax therapy was discontinued approximately 6 years ago.   She had been on this medication in excess of 5 years. She remains on calcium and vitamin D supplements. She is asymptomatic.   Followup bone density study approximately 2015  revealed no  progression but still fairly mild osteoporosis (-2.7).  She does complain of loss of height and no pain.    Carotid artery Doppler ultrasound 2015, also stable.  Follow-up in 2 years recommended     Current Allergies:  No known allergies   Past Medical History:  Osteoporosis  glaucoma   Past Surgical History:  childhood tonsillectomy, 1932  gravida two, para two, abortus zero  Status post cataract extraction surgery  Family History:  father died age 74 Mother died at age 79, leukemia  no siblings   Social History:  relocate her from the [place] daughter lives in [place], who is a Programmer, applications. at [employer] Factors:  Tobacco use: never    Past Medical History:  Diagnosis Date  . Carotid bruit    right  . Glaucoma   . OP (osteoporosis)    Past Surgical History:  Procedure Laterality Date  . CATARACT EXTRACTION  08/2007  . TONSILLECTOMY     childhood 1930    reports that she has never smoked. She has never used smokeless tobacco. She reports that she does not drink alcohol or use drugs. family history is not on file. Allergies  Allergen Reactions  . Quinine Derivatives Palpitations    Nausea, weak     Medicare wellness  1. Risk factors, based on past  M,S,F history-  cardiovascular risk factors include age. She has a history of a right carotid bruit but this has not been audible for some time 2.  Physical activities: Remains quite active and independent no exercise limitations.  She does walk her daughter's dog 2-3 times daily.  Does have a stationary exercise bike and some weights at home that she uses occasionally  3.  Depression/mood: History depression or mood disorder  4.  Hearing: No deficits  5.  ADL's: Independent in all aspects of daily living  6.  Fall risk: Low  7.  Home safety: No problems identified  8.  Height weight, and visual acuity; height and weight stable she is followed by ophthalmology for glaucoma. She has a history of bilateral cataract extraction surgery  9.  Counseling: Heart healthy diet active lifestyle all encouraged baby aspirin 81 mg daily recommended  10. Lab orders based on risk factors: Laboratory profile will be reviewed  11. Referral: Followup ophthalmology.  12. Care plan: Aspirin 81 mg daily she'll continue calcium and vitamin D supplementation.  We'll schedule follow carotid artery Doppler ultrasound  13. Cognitive assessment: Alert and oriented normal affect. No cognitive dysfunction  14.  Preventive services will include annual clinical examinations with screening lab.  The patient continue to have mammograms every  1-2 years.  She is seen by ophthalmology annually.  We'll continue to monitor for carotid artery stenosis and osteoporosis.    15.  Provider list includes primary care ophthalmology and radiology  Past Medical History:  Diagnosis Date  . Carotid bruit    right  . Glaucoma   . OP (osteoporosis)     Social History   Social History  . Marital status: Widowed    Spouse name: N/A  . Number of children: N/A  . Years of education: N/A   Occupational History  . Not on file.   Social History Main Topics  . Smoking  status: Never Smoker  . Smokeless tobacco: Never Used  . Alcohol use No  . Drug use: No  . Sexual activity: Not on file   Other Topics Concern  . Not on file   Social History Narrative  . No narrative on file    Past Surgical History:  Procedure Laterality Date  . CATARACT EXTRACTION  08/2007  . TONSILLECTOMY     childhood 1930    No family history on file.  Allergies  Allergen Reactions  . Quinine Derivatives Palpitations    Nausea, weak    Current Outpatient Prescriptions on File Prior to Visit  Medication Sig Dispense Refill  . aspirin 81 MG tablet Take 81 mg by mouth daily.     . Calcium Citrate-Vitamin D (CALCIUM CITRATE + D3 MAXIMUM) 315-250 MG-UNIT TABS Take 2 tablets by mouth 2 (two) times daily. 120 tablet   . CycloSPORINE (RESTASIS OP) Apply 1 drop to eye 2 (two) times daily.    . dorzolamide-timolol (COSOPT) 22.3-6.8 MG/ML ophthalmic solution Place 1 drop into both eyes 2 (two) times daily.     . Multiple Vitamins-Minerals (CENTRUM SILVER PO) Take 1 tablet by mouth daily.       No current facility-administered medications on file prior to visit.     Temp 97.8 F (36.6 C) (Oral)   Resp 20   Ht 5' 0.75" (1.543 m)   Wt 126 lb (57.2 kg)   BMI 24.00 kg/m       Review of Systems  Constitutional: Negative for appetite change, fatigue, fever and unexpected weight change.  HENT: Negative for congestion, dental problem, ear pain, hearing loss, mouth sores, nosebleeds, sinus pressure, sore throat, tinnitus, trouble swallowing and voice change.   Eyes: Negative for photophobia, pain, redness and visual disturbance.  Respiratory: Negative for cough, chest tightness and shortness of breath.   Cardiovascular: Negative for chest pain, palpitations and leg swelling.  Gastrointestinal: Negative for abdominal distention, abdominal pain, blood in stool, constipation, diarrhea, nausea, rectal pain and vomiting.  Genitourinary: Negative for difficulty urinating, dysuria,  flank pain, frequency, genital sores, hematuria, menstrual problem, pelvic pain, urgency, vaginal bleeding, vaginal discharge and vaginal pain.  Musculoskeletal: Negative for arthralgias, back pain and neck stiffness.  Skin: Negative for rash.  Neurological: Negative for dizziness, syncope, speech difficulty, weakness, light-headedness, numbness and headaches.  Hematological: Negative for adenopathy. Does not bruise/bleed easily.  Psychiatric/Behavioral: Negative for agitation, behavioral problems, dysphoric mood, self-injury and suicidal ideas. The patient is not nervous/anxious.        Objective:   Physical Exam  Constitutional: She is oriented to person, place, and time. She appears well-developed and well-nourished.  HENT:  Head: Normocephalic and atraumatic.  Right Ear: External ear normal.  Left Ear: External ear normal.  Mouth/Throat: Oropharynx is clear and moist.  Eyes: Conjunctivae and EOM are normal.  Neck: Normal range of motion.  Neck supple. No JVD present. No thyromegaly present.  Bilateral carotid bruits not noted today  Cardiovascular: Normal rate, regular rhythm, normal heart sounds and intact distal pulses.   No murmur heard. Dorsalis pedis pulses not easily palpable.  Posterior tibial pulses intact  Pulmonary/Chest: Effort normal and breath sounds normal. She has no wheezes. She has no rales.  Kyphosis  Abdominal: Soft. Bowel sounds are normal. She exhibits no distension and no mass. There is no tenderness. There is no rebound and no guarding.  Musculoskeletal: Normal range of motion. She exhibits no edema or tenderness.  Neurological: She is alert and oriented to person, place, and time. She has normal reflexes. No cranial nerve deficit. She exhibits normal muscle tone. Coordination normal.  Skin: Skin is warm and dry. No rash noted.  Psychiatric: She has a normal mood and affect. Her behavior is normal.          Assessment & Plan:   Annual health  examination Osteoporosis.  Status post treatment with Fosamax.  Follow-up bone density last year revealed no progression.  Will recheck bone density in one year Bilateral carotid bruits.  Statin therapy Discussed last year.  History of poor tolerance  Continue on daily low-dose aspirin.  Follow up carotid artery Doppler ultrasound.   Recheck 1 year Check screening lab Annual eye examination All immunizations up-to-date  Nyoka Cowden

## 2016-01-10 NOTE — Progress Notes (Signed)
Pre visit review using our clinic review tool, if applicable. No additional management support is needed unless otherwise documented below in the visit note. 

## 2016-01-16 DIAGNOSIS — H02839 Dermatochalasis of unspecified eye, unspecified eyelid: Secondary | ICD-10-CM | POA: Diagnosis not present

## 2016-01-16 DIAGNOSIS — H18413 Arcus senilis, bilateral: Secondary | ICD-10-CM | POA: Diagnosis not present

## 2016-01-16 DIAGNOSIS — H401131 Primary open-angle glaucoma, bilateral, mild stage: Secondary | ICD-10-CM | POA: Diagnosis not present

## 2016-01-16 DIAGNOSIS — H04129 Dry eye syndrome of unspecified lacrimal gland: Secondary | ICD-10-CM | POA: Diagnosis not present

## 2016-02-25 DIAGNOSIS — H401131 Primary open-angle glaucoma, bilateral, mild stage: Secondary | ICD-10-CM | POA: Diagnosis not present

## 2016-02-25 DIAGNOSIS — H02839 Dermatochalasis of unspecified eye, unspecified eyelid: Secondary | ICD-10-CM | POA: Diagnosis not present

## 2016-02-25 DIAGNOSIS — H18413 Arcus senilis, bilateral: Secondary | ICD-10-CM | POA: Diagnosis not present

## 2016-04-02 DIAGNOSIS — H16223 Keratoconjunctivitis sicca, not specified as Sjogren's, bilateral: Secondary | ICD-10-CM | POA: Diagnosis not present

## 2016-04-02 DIAGNOSIS — H0289 Other specified disorders of eyelid: Secondary | ICD-10-CM | POA: Diagnosis not present

## 2016-04-13 ENCOUNTER — Ambulatory Visit (INDEPENDENT_AMBULATORY_CARE_PROVIDER_SITE_OTHER): Payer: Medicare Other | Admitting: Internal Medicine

## 2016-04-13 ENCOUNTER — Encounter: Payer: Self-pay | Admitting: Internal Medicine

## 2016-04-13 VITALS — BP 122/56 | HR 90 | Temp 97.8°F | Ht 60.0 in | Wt 125.4 lb

## 2016-04-13 DIAGNOSIS — I951 Orthostatic hypotension: Secondary | ICD-10-CM

## 2016-04-13 DIAGNOSIS — J111 Influenza due to unidentified influenza virus with other respiratory manifestations: Secondary | ICD-10-CM

## 2016-04-13 LAB — BASIC METABOLIC PANEL
BUN: 20 mg/dL (ref 6–23)
CALCIUM: 9.2 mg/dL (ref 8.4–10.5)
CO2: 27 mEq/L (ref 19–32)
Chloride: 102 mEq/L (ref 96–112)
Creatinine, Ser: 0.86 mg/dL (ref 0.40–1.20)
GFR: 65.76 mL/min (ref >60.00)
GLUCOSE: 137 mg/dL — AB (ref 70–99)
POTASSIUM: 4.6 meq/L (ref 3.5–5.1)
Sodium: 137 mEq/L (ref 135–145)

## 2016-04-13 LAB — CBC WITH DIFFERENTIAL/PLATELET
Basophils Absolute: 0.1 10*3/uL (ref 0.0–0.1)
Basophils Relative: 1.3 % (ref 0.0–3.0)
EOS ABS: 0.2 10*3/uL (ref 0.0–0.7)
EOS PCT: 2.5 % (ref 0.0–5.0)
HCT: 35.1 % — ABNORMAL LOW (ref 36.0–46.0)
HEMOGLOBIN: 11.7 g/dL — AB (ref 12.0–15.0)
Lymphocytes Relative: 17.4 % (ref 12.0–46.0)
Lymphs Abs: 1.1 10*3/uL (ref 0.7–4.0)
MCHC: 33.3 g/dL (ref 30.0–36.0)
MCV: 92.6 fl (ref 78.0–100.0)
Monocytes Absolute: 0.6 10*3/uL (ref 0.1–1.0)
Monocytes Relative: 9.5 % (ref 3.0–12.0)
NEUTROS ABS: 4.2 10*3/uL (ref 1.4–7.7)
Neutrophils Relative %: 69.3 % (ref 43.0–77.0)
PLATELETS: 483 10*3/uL — AB (ref 150.0–400.0)
RBC: 3.79 Mil/uL — ABNORMAL LOW (ref 3.87–5.11)
RDW: 13.5 % (ref 11.5–15.5)
WBC: 6 10*3/uL (ref 4.0–10.5)

## 2016-04-13 NOTE — Progress Notes (Signed)
Pre visit review using our clinic review tool, if applicable. No additional management support is needed unless otherwise documented below in the visit note. 

## 2016-04-13 NOTE — Patient Instructions (Signed)
Drink as much fluid as you  can tolerate over the next few days  Rest  Call or return to clinic prn if these symptoms worsen or fail to improve as anticipated.

## 2016-04-13 NOTE — Progress Notes (Signed)
Subjective:    Patient ID: Tina Schroeder, female    DOB: 10-17-1923, 83 y.o.   MRN: [id number]  HPI  83 year-old patient who has been unwell for approximately 10 days.  She initially developed some nausea and lies myalgia.  She was quite weak and spent 4 days in bed.  Associated symptoms have included cough, anorexia and weakness.  Several days ago.  She had a near syncopal episode and noted her blood pressure be as low as 88 over 60, she seems to be improving.  No recent fever. Her appetite has been poor, but her daughter has been in attendance and has been encouraging liberal fluid intake  Past Medical History:  Diagnosis Date  . Carotid bruit    right  . Glaucoma   . OP (osteoporosis)      Social History   Social History  . Marital status: Widowed    Spouse name: N/A  . Number of children: N/A  . Years of education: N/A   Occupational History  . Not on file.   Social History Main Topics  . Smoking status: Never Smoker  . Smokeless tobacco: Never Used  . Alcohol use No  . Drug use: No  . Sexual activity: Not on file   Other Topics Concern  . Not on file   Social History Narrative  . No narrative on file    Past Surgical History:  Procedure Laterality Date  . CATARACT EXTRACTION  08/2007  . TONSILLECTOMY     childhood 1930    No family history on file.  Allergies  Allergen Reactions  . Quinine Derivatives Palpitations    Nausea, weak    Current Outpatient Prescriptions on File Prior to Visit  Medication Sig Dispense Refill  . aspirin 81 MG tablet Take 81 mg by mouth daily.     . Calcium Citrate-Vitamin D (CALCIUM CITRATE + D3 MAXIMUM) 315-250 MG-UNIT TABS Take 2 tablets by mouth 2 (two) times daily. 120 tablet   . CycloSPORINE (RESTASIS OP) Apply 1 drop to eye 2 (two) times daily.    . dorzolamide-timolol (COSOPT) 22.3-6.8 MG/ML ophthalmic solution Place 1 drop into both eyes 2 (two) times daily.     . Multiple Vitamins-Minerals (CENTRUM SILVER PO) Take  1 tablet by mouth daily.       No current facility-administered medications on file prior to visit.     BP (!) 122/56 (BP Location: Left Arm, Patient Position: Sitting, Cuff Size: Normal)   Pulse 90   Temp 97.8 F (36.6 C) (Oral)   Ht 5' (1.524 m)   Wt 125 lb 6.4 oz (56.9 kg)   BMI 24.49 kg/m     Review of Systems  Constitutional: Positive for activity change, appetite change and fatigue.  HENT: Negative for congestion, dental problem, hearing loss, rhinorrhea, sinus pressure, sore throat and tinnitus.   Eyes: Negative for pain, discharge and visual disturbance.  Respiratory: Positive for cough. Negative for shortness of breath.   Cardiovascular: Negative for chest pain, palpitations and leg swelling.  Gastrointestinal: Positive for nausea. Negative for abdominal distention, abdominal pain, blood in stool, constipation, diarrhea and vomiting.  Genitourinary: Negative for difficulty urinating, dysuria, flank pain, frequency, hematuria, pelvic pain, urgency, vaginal bleeding, vaginal discharge and vaginal pain.  Musculoskeletal: Negative for arthralgias, gait problem and joint swelling.  Skin: Negative for rash.  Neurological: Positive for dizziness and weakness. Negative for syncope, speech difficulty, numbness and headaches.  Hematological: Negative for adenopathy.  Psychiatric/Behavioral: Negative for agitation, behavioral  problems and dysphoric mood. The patient is not nervous/anxious.        Objective:   Physical Exam  Constitutional: She is oriented to person, place, and time. She appears well-developed and well-nourished.  Appears well and in no distress Afebrile Blood pressure 120/60    HENT:  Head: Normocephalic.  Right Ear: External ear normal.  Left Ear: External ear normal.  Mouth/Throat: Oropharynx is clear and moist.  Eyes: Conjunctivae and EOM are normal. Pupils are equal, round, and reactive to light.  Neck: Normal range of motion. Neck supple. No  thyromegaly present.  Cardiovascular: Normal rate, regular rhythm, normal heart sounds and intact distal pulses.   Pulmonary/Chest: Effort normal and breath sounds normal.  Abdominal: Soft. Bowel sounds are normal. She exhibits no mass. There is no tenderness.  Musculoskeletal: Normal range of motion.  Lymphadenopathy:    She has no cervical adenopathy.  Neurological: She is alert and oriented to person, place, and time.  Skin: Skin is warm and dry. No rash noted.  Psychiatric: She has a normal mood and affect. Her behavior is normal.          Assessment & Plan:   Viral syndrome Episode of near syncope secondary to volume depletion.  Patient continues to improve and is tolerating by mouth intake.  Will continue to encourage liberal fluids  Nyoka Cowden

## 2016-04-19 ENCOUNTER — Encounter (HOSPITAL_COMMUNITY): Payer: Medicare Other

## 2016-05-01 ENCOUNTER — Ambulatory Visit (INDEPENDENT_AMBULATORY_CARE_PROVIDER_SITE_OTHER)
Admission: RE | Admit: 2016-05-01 | Discharge: 2016-05-01 | Disposition: A | Discharge status: Home / Self Care | Payer: Medicare Other | Source: Ambulatory Visit | Attending: Family Medicine | Admitting: Family Medicine

## 2016-05-01 ENCOUNTER — Encounter: Payer: Self-pay | Admitting: Family Medicine

## 2016-05-01 ENCOUNTER — Ambulatory Visit (INDEPENDENT_AMBULATORY_CARE_PROVIDER_SITE_OTHER): Payer: Medicare Other | Admitting: Family Medicine

## 2016-05-01 VITALS — BP 140/80 | HR 87 | Ht 60.0 in | Wt 123.3 lb

## 2016-05-01 DIAGNOSIS — R059 Cough, unspecified: Secondary | ICD-10-CM

## 2016-05-01 DIAGNOSIS — R05 Cough: Secondary | ICD-10-CM | POA: Diagnosis not present

## 2016-05-01 DIAGNOSIS — R630 Anorexia: Secondary | ICD-10-CM

## 2016-05-01 DIAGNOSIS — R634 Abnormal weight loss: Secondary | ICD-10-CM

## 2016-05-01 DIAGNOSIS — I517 Cardiomegaly: Secondary | ICD-10-CM

## 2016-05-01 DIAGNOSIS — R0602 Shortness of breath: Secondary | ICD-10-CM | POA: Diagnosis not present

## 2016-05-01 LAB — CBC WITH DIFFERENTIAL/PLATELET
BASOS ABS: 0.1 10*3/uL (ref 0.0–0.1)
BASOS PCT: 1.7 % (ref 0.0–3.0)
EOS PCT: 5.1 % — AB (ref 0.0–5.0)
Eosinophils Absolute: 0.4 10*3/uL (ref 0.0–0.7)
HEMATOCRIT: 35.4 % — AB (ref 36.0–46.0)
Hemoglobin: 11.8 g/dL — ABNORMAL LOW (ref 12.0–15.0)
LYMPHS PCT: 18 % (ref 12.0–46.0)
Lymphs Abs: 1.3 10*3/uL (ref 0.7–4.0)
MCHC: 33.3 g/dL (ref 30.0–36.0)
MCV: 89.9 fl (ref 78.0–100.0)
Monocytes Absolute: 0.8 10*3/uL (ref 0.1–1.0)
Monocytes Relative: 11.3 % (ref 3.0–12.0)
Neutro Abs: 4.7 10*3/uL (ref 1.4–7.7)
Neutrophils Relative %: 63.9 % (ref 43.0–77.0)
Platelets: 469 10*3/uL — ABNORMAL HIGH (ref 150.0–400.0)
RBC: 3.94 Mil/uL (ref 3.87–5.11)
RDW: 14.1 % (ref 11.5–15.5)
WBC: 7.3 10*3/uL (ref 4.0–10.5)

## 2016-05-01 LAB — COMPREHENSIVE METABOLIC PANEL
ALT: 20 U/L (ref 0–35)
AST: 22 U/L (ref 0–37)
Albumin: 3.8 g/dL (ref 3.5–5.2)
Alkaline Phosphatase: 91 U/L (ref 39–117)
BUN: 22 mg/dL (ref 6–23)
CO2: 29 mEq/L (ref 19–32)
Calcium: 9.7 mg/dL (ref 8.4–10.5)
Chloride: 103 mEq/L (ref 96–112)
Creatinine, Ser: 0.66 mg/dL (ref 0.40–1.20)
GFR: 89.25 mL/min (ref >60.00)
Glucose, Bld: 99 mg/dL (ref 70–99)
Potassium: 4.6 mEq/L (ref 3.5–5.1)
Sodium: 137 mEq/L (ref 135–145)
Total Bilirubin: 0.6 mg/dL (ref 0.2–1.2)
Total Protein: 6.9 g/dL (ref 6.0–8.3)

## 2016-05-01 LAB — SEDIMENTATION RATE: Sed Rate: 36 mm/hr — ABNORMAL HIGH (ref 0–30)

## 2016-05-01 LAB — TSH: TSH: 0.84 u[IU]/mL (ref 0.35–4.50)

## 2016-05-01 NOTE — Progress Notes (Addendum)
Subjective:     Patient ID: Tina Schroeder, female   DOB: 12-22-1923, 83 y.o.   MRN: [id number]  HPI Patient is 83 years old seen with some increased malaise, dry cough and modest weight loss. She came in on February 2 of this month with cough and presumed viral etiology. Her basic metabolic panel was essentially normal. She had CBC with normal white count and hemoglobin 11.7 which was slightly decreased from 13 range few months ago.  She's had about 2 pound weight loss since then and daughter states they're having to "force feed "in order to keep her weight up. She has some early satiety. She has never had any nausea, vomiting, or diarrhea. Never smoked. No chest pain. Occasional mild dizziness. No syncope. Her cough is dry. They have noted that her resting heart rate has been around 90 and usually this is in the 60s. She currently does not take any regular prescription medications and she denies abdominal pain. No headache. No change in stool habits. No dysuria. No fevers or chills. No recent falls.  Past Medical History:  Diagnosis Date  . Carotid bruit    right  . Glaucoma   . OP (osteoporosis)    Past Surgical History:  Procedure Laterality Date  . CATARACT EXTRACTION  08/2007  . TONSILLECTOMY     childhood 1930    reports that she has never smoked. She has never used smokeless tobacco. She reports that she does not drink alcohol or use drugs. family history is not on file. Allergies  Allergen Reactions  . Quinine Derivatives Palpitations    Nausea, weak     Review of Systems  Constitutional: Positive for appetite change, fatigue and unexpected weight change. Negative for chills and fever.  HENT: Negative for sore throat and trouble swallowing.   Respiratory: Positive for cough and shortness of breath.   Gastrointestinal: Negative for abdominal pain, diarrhea, nausea and vomiting.  Genitourinary: Negative for dysuria.  Neurological: Positive for light-headedness. Negative for  syncope.  Hematological: Negative for adenopathy. Does not bruise/bleed easily.       Objective:   Physical Exam  Constitutional: She is oriented to person, place, and time. She appears well-developed and well-nourished.  HENT:  Mouth/Throat: Oropharynx is clear and moist.  Neck: Neck supple. No thyromegaly present.  Cardiovascular: Normal rate and regular rhythm.  Exam reveals no gallop.   Pulmonary/Chest: Effort normal and breath sounds normal. No respiratory distress. She has no wheezes. She has no rales.  Abdominal: Soft. Bowel sounds are normal. She exhibits no distension and no mass. There is no tenderness. There is no rebound and no guarding.  Musculoskeletal: She exhibits no edema.  Lymphadenopathy:    She has no cervical adenopathy.  Neurological: She is alert and oriented to person, place, and time. No cranial nerve deficit.  Skin: No rash noted.  Psychiatric: She has a normal mood and affect. Her behavior is normal.       Assessment:     Patient seen with multiple nonspecific findings including recent change of appetite, mild weight loss, dry cough, general fatigue. Recent hemoglobin [id number] of uncertain significance. She feels her heart rate is above baseline by about 30 points but is very regular on exam today    Plan:     -Check TSH along with comprehensive metabolic panel and CBC -PA and lateral chest x-ray -Discussed nutritional supplements such as boost -We recommended frequent small meals as opposed to 3 meals per day and there are already doing  some of this -Also add sedimentation rate.  Eulas Post MD [facility] unremarkable. Patient and daughter notified. Chest x-ray showed no infiltrate. Small left pleural effusion. Cardiomegaly which is a new finding. No old x-rays for comparison. Discussed with patient and daughter recommendation for echocardiogram to further assess  Eulas Post MD [facility]  [facility]

## 2016-05-01 NOTE — Progress Notes (Signed)
Pre visit review using our clinic review tool, if applicable. No additional management support is needed unless otherwise documented below in the visit note. 

## 2016-05-02 NOTE — Addendum Note (Signed)
Addended by: Eulas Post on: 05/02/2016 08:13 AM   Modules accepted: Orders

## 2016-05-03 ENCOUNTER — Encounter (HOSPITAL_COMMUNITY): Payer: Medicare Other

## 2016-05-15 ENCOUNTER — Other Ambulatory Visit (HOSPITAL_COMMUNITY): Payer: Medicare Other

## 2016-05-18 ENCOUNTER — Ambulatory Visit (HOSPITAL_COMMUNITY): Payer: Medicare Other | Attending: Internal Medicine

## 2016-05-18 ENCOUNTER — Other Ambulatory Visit: Payer: Self-pay

## 2016-05-18 DIAGNOSIS — I517 Cardiomegaly: Secondary | ICD-10-CM

## 2016-05-18 LAB — ECHOCARDIOGRAM COMPLETE
E decel time: 232 msec
EERAT: 11.31
FS: 32 % (ref 28–44)
IV/PV OW: 1.31
LA diam end sys: 26 mm
LA vol A4C: 43.1 ml
LA vol index: 23.9 mL/m2
LA vol: 36.4 mL
LADIAMINDEX: 1.71 cm/m2
LASIZE: 26 mm
LV E/e' medial: 11.31
LV E/e'average: 11.31
LV PW d: 8.39 mm — AB (ref 0.6–1.1)
LVELAT: 6.09 cm/s
LVOT area: 2.27 cm2
LVOT diameter: 17 mm
MV Dec: 232
MVPKAVEL: 83.9 m/s
MVPKEVEL: 68.9 m/s
RV LATERAL S' VELOCITY: 12.5 cm/s
TDI e' lateral: 6.09
TDI e' medial: 6.31

## 2016-05-22 ENCOUNTER — Telehealth: Payer: Self-pay | Admitting: Internal Medicine

## 2016-05-22 DIAGNOSIS — R0602 Shortness of breath: Secondary | ICD-10-CM

## 2016-05-22 DIAGNOSIS — H0289 Other specified disorders of eyelid: Secondary | ICD-10-CM | POA: Diagnosis not present

## 2016-05-22 DIAGNOSIS — Z961 Presence of intraocular lens: Secondary | ICD-10-CM | POA: Diagnosis not present

## 2016-05-22 DIAGNOSIS — H401131 Primary open-angle glaucoma, bilateral, mild stage: Secondary | ICD-10-CM | POA: Diagnosis not present

## 2016-05-22 DIAGNOSIS — H04129 Dry eye syndrome of unspecified lacrimal gland: Secondary | ICD-10-CM | POA: Diagnosis not present

## 2016-05-22 NOTE — Telephone Encounter (Signed)
Spoke with daughter and she would like for the patient to have a referral to cardiology if possible.   The patient is still having some shortness of breath.  Okay to refer?

## 2016-05-22 NOTE — Addendum Note (Signed)
Addended by: Westley Hummer B on: 05/22/2016 02:54 PM   Modules accepted: Orders

## 2016-05-22 NOTE — Telephone Encounter (Signed)
Referral placed and daughter is aware.

## 2016-05-22 NOTE — Telephone Encounter (Signed)
OK 

## 2016-05-22 NOTE — Telephone Encounter (Signed)
Tina Schroeder pt return you call and would like to have a call before 1:30 concerning her MRI results.

## 2016-05-23 ENCOUNTER — Telehealth: Payer: Self-pay | Admitting: Internal Medicine

## 2016-05-23 NOTE — Telephone Encounter (Signed)
Spoke with Jacqlyn Larsen and Dr Talbert Forest will order the CT

## 2016-05-23 NOTE — Telephone Encounter (Signed)
Pt please call daughter at [phone]

## 2016-05-23 NOTE — Telephone Encounter (Signed)
Error/njr °

## 2016-05-23 NOTE — Telephone Encounter (Signed)
Becky called from [facility] [employer].  Pt was seen 05/23/15 and has Visual field changes that corollate with right parietal stroke.  Dr. Talbert Forest is asking if Dr. Raliegh Ip would order a CT of head or if he would like Dr. Talbert Forest to.  They can fax all notes.  Can be reached at [phone] press option 1 and ask for  [facility].  Pt has also seen Dr. Elease Hashimoto who ordered referral to cardiologist.

## 2016-05-23 NOTE — Telephone Encounter (Signed)
Please give daughter a call when referral is done

## 2016-05-24 ENCOUNTER — Telehealth: Payer: Self-pay | Admitting: Interventional Cardiology

## 2016-05-24 DIAGNOSIS — H5340 Unspecified visual field defects: Secondary | ICD-10-CM | POA: Diagnosis not present

## 2016-05-24 NOTE — Telephone Encounter (Signed)
Spoke with daughter, Edd Fabian.  Advised her of recommendations per Dr. Tamala Julian.  Daughter states that when she called for original appt she had given names of Lester Kinsman and Radford Pax.  Advised her that I could get pt in with Dr. Harrington Challenger on 06/18/16 and we could keep pt on wait list for sooner appt if anything became available.  Daughter requested that I move appt to 4/9 with Dr. Harrington Challenger and keep pt on wait list.  Daughter appreciative for assistance.

## 2016-05-24 NOTE — Telephone Encounter (Signed)
She needs to go ahead and be seen by the first person available. I will be able to see their formation. This will be easier than try to create a space.

## 2016-05-24 NOTE — Telephone Encounter (Signed)
New message   Pt c/o Shortness Of Breath: STAT if SOB developed within the last 24 hours or pt is noticeably SOB on the phone  1. Are you currently SOB (can you hear that pt is SOB on the phone)? no  2. How long have you been experiencing SOB? Off and on since middle of january  3. Are you SOB when sitting or when up moving around? both  4. Are you currently experiencing any other symptoms? Loss of appetite, lost 10lbs, bending over makes her lightheaded, chest xray showed she had enlarged heart and fluid

## 2016-05-24 NOTE — Telephone Encounter (Signed)
Patient's daughter Tina Schroeder is calling regarding her mother. She is visiting from [place]. DPR on file only for daughter Tina Schroeder who is local. Spoke with the patient and the patient gives permission to discuss her care with her daughter Tina Schroeder.   The daughter states that her mother has not been feeling well since January and has been seen a few times by her PCP and had lab work done that came back normal. She states that her mother has been SOB on and off since January. She states that Dr. Elease Hashimoto ordered a CXR that showed an enlarged heart and fluid. She states that Dr. Sheldon Silvan also ordered an echocardiogram. She has been referred to cardiology and is scheduled to see Dr. Tamala Julian on 06/26/16. She states that her mother has a decreased appetite and has lost 10 lbs in the last month, and that she gets lightheaded when she bends over. She states that her mother is just SOB off and on and that it is not associated with activity. She states that her mother does not have any swelling in her LE and denies any chest pain. The daughter would like to know if her mother can be seen sooner. Message will be forwarded to Dr. Tamala Julian and his nurse for review. Daughter verbalizes understanding.

## 2016-05-30 DIAGNOSIS — L688 Other hypertrichosis: Secondary | ICD-10-CM | POA: Diagnosis not present

## 2016-05-30 DIAGNOSIS — H04129 Dry eye syndrome of unspecified lacrimal gland: Secondary | ICD-10-CM | POA: Diagnosis not present

## 2016-05-30 DIAGNOSIS — H16223 Keratoconjunctivitis sicca, not specified as Sjogren's, bilateral: Secondary | ICD-10-CM | POA: Diagnosis not present

## 2016-05-30 DIAGNOSIS — H0289 Other specified disorders of eyelid: Secondary | ICD-10-CM | POA: Diagnosis not present

## 2016-05-31 ENCOUNTER — Ambulatory Visit (HOSPITAL_COMMUNITY)
Admission: RE | Admit: 2016-05-31 | Discharge: 2016-05-31 | Disposition: A | Discharge status: Home / Self Care | Payer: Medicare Other | Source: Ambulatory Visit | Attending: Internal Medicine | Admitting: Internal Medicine

## 2016-05-31 DIAGNOSIS — I6529 Occlusion and stenosis of unspecified carotid artery: Secondary | ICD-10-CM | POA: Diagnosis present

## 2016-05-31 DIAGNOSIS — I771 Stricture of artery: Secondary | ICD-10-CM | POA: Diagnosis not present

## 2016-05-31 DIAGNOSIS — I6523 Occlusion and stenosis of bilateral carotid arteries: Secondary | ICD-10-CM | POA: Insufficient documentation

## 2016-06-06 ENCOUNTER — Encounter: Payer: Self-pay | Admitting: Internal Medicine

## 2016-06-18 ENCOUNTER — Ambulatory Visit (INDEPENDENT_AMBULATORY_CARE_PROVIDER_SITE_OTHER): Payer: Medicare Other | Admitting: Internal Medicine

## 2016-06-18 ENCOUNTER — Encounter: Payer: Self-pay | Admitting: Internal Medicine

## 2016-06-18 VITALS — BP 138/68 | HR 95 | Ht 60.0 in | Wt 122.8 lb

## 2016-06-18 DIAGNOSIS — R000 Tachycardia, unspecified: Secondary | ICD-10-CM

## 2016-06-18 NOTE — Patient Instructions (Signed)
Your physician recommends that you continue on your current medications as directed. Please refer to the Current Medication list given to you today. Your physician recommends that you schedule a follow-up appointment in: as needed with Dr. Ross.   

## 2016-06-18 NOTE — Progress Notes (Signed)
Cardiology Office Note   Date:  06/18/2016   ID:  Berklie Dethlefs, DOB 01/23/1924, MRN [id number]  PCP:  Nyoka Cowden, MD  Cardiologist:   Dorris Carnes, MD   Pt referred by Dr Raliegh Ip for eval of SOB and faster HR     History of Present Illness: Tina Schroeder is a 83 y.o. female with no known history of heart problems The pt was doing OK until a few months ago  May have had a mild viral infection   Then She lost her appetite, lost weight  She had no energy   Alos noted her heart rate was higher at times (90s)    Mostly she says when she was in bed at night.  In past her HR has been in the 60s   Over the past few wks she says her appetite is increased  Wt is going up  Energy is better  HR is still a little high aat times    Current Meds  Medication Sig  . aspirin 81 MG tablet Take 81 mg by mouth daily.   . bimatoprost (LUMIGAN) 0.03 % ophthalmic solution Place 1 drop into both eyes at bedtime.  . Calcium Citrate-Vitamin D (CALCIUM CITRATE + D3 MAXIMUM) 315-250 MG-UNIT TABS Take 2 tablets by mouth 2 (two) times daily. (Patient taking differently: Takes 2 tablets once per day.)  . Multiple Vitamins-Minerals (CENTRUM SILVER PO) Take 1 tablet by mouth daily.    . RESTASIS 0.05 % ophthalmic emulsion Place 1 drop into both eyes 2 (two) times daily.   . [DISCONTINUED] CycloSPORINE (RESTASIS OP) Apply 1 drop to eye 2 (two) times daily.     Allergies:   Quinine derivatives   Past Medical History:  Diagnosis Date  . Carotid bruit    right  . Glaucoma   . OP (osteoporosis)     Past Surgical History:  Procedure Laterality Date  . CATARACT EXTRACTION  08/2007  . TONSILLECTOMY     childhood 1930     Social History:  The patient  reports that she has never smoked. She has never used smokeless tobacco. She reports that she does not drink alcohol or use drugs.   Family History:  The patient's family history is not on file.    ROS:  Please see the history of present illness. All other  systems are reviewed and  Negative to the above problem except as noted.    PHYSICAL EXAM: VS:  BP 138/68   Pulse 95   Ht 5' (1.524 m)   Wt 122 lb 12.8 oz (55.7 kg)   BMI 23.98 kg/m   GEN: Well nourished, well developed, in no acute distress  HEENT: normal  Neck: no JVD, carotid bruits, or masses Cardiac: RRR; no murmurs, rubs, or gallops,no edema  Respiratory:  clear to auscultation bilaterally, normal work of breathing GI: soft, nontender, nondistended, + BS  No hepatomegaly  MS: no deformity Moving all extremities   Skin: warm and dry, no rash Neuro:  Strength and sensation are intact Psych: euthymic mood, full affect   EKG:  EKG is ordered today.  SR 95 bpm     Lipid Panel    Component Value Date/Time   CHOL 208 (H) 01/10/2016 1018   TRIG 92.0 01/10/2016 1018   HDL 74.90 01/10/2016 1018   CHOLHDL 3 01/10/2016 1018   VLDL 18.4 01/10/2016 1018   LDLCALC 114 (H) 01/10/2016 1018   LDLDIRECT 140.5 09/07/2010 1042      Wt Readings  from Last 3 Encounters:  06/18/16 122 lb 12.8 oz (55.7 kg)  05/01/16 123 lb 4.8 oz (55.9 kg)  04/13/16 125 lb 6.4 oz (56.9 kg)      ASSESSMENT AND PLAN:  Pt a 83 yo with recent wt loss, due to decreased appetite, low energy and faster HR. Etiology not clear  It is getting better   Except for HR being higher than in the past she is near normal No CP  Breathing has been OK  Energy is better  It sounds like she may have had a viral infection that was low grade  HR may be last thing to recover.  I am not convinced that she has an arrhythmia (ie afib)  I have incouraged her to check pulse for regularity / speeed.   Encouraged her to stay hydrated, increase fluids    F/U as needed if symptoms change    Current medicines are reviewed at length with the patient today.  The patient does not have concerns regarding medicines.  Signed, Dorris Carnes, MD  06/18/2016 1:50 PM    [facility] [place], [place], [place]   [address] Phone: [phone]; Fax: [phone]

## 2016-06-26 ENCOUNTER — Ambulatory Visit: Payer: Medicare Other | Admitting: Interventional Cardiology

## 2016-07-27 ENCOUNTER — Other Ambulatory Visit: Payer: Self-pay | Admitting: Internal Medicine

## 2016-07-27 DIAGNOSIS — Z1231 Encounter for screening mammogram for malignant neoplasm of breast: Secondary | ICD-10-CM

## 2016-08-17 ENCOUNTER — Ambulatory Visit
Admission: RE | Admit: 2016-08-17 | Discharge: 2016-08-17 | Disposition: A | Discharge status: Home / Self Care | Payer: Medicare Other | Source: Ambulatory Visit | Attending: Internal Medicine | Admitting: Internal Medicine

## 2016-08-17 DIAGNOSIS — Z1231 Encounter for screening mammogram for malignant neoplasm of breast: Secondary | ICD-10-CM

## 2016-09-04 DIAGNOSIS — H16223 Keratoconjunctivitis sicca, not specified as Sjogren's, bilateral: Secondary | ICD-10-CM | POA: Diagnosis not present

## 2016-09-04 DIAGNOSIS — H26491 Other secondary cataract, right eye: Secondary | ICD-10-CM | POA: Diagnosis not present

## 2016-09-04 DIAGNOSIS — H401131 Primary open-angle glaucoma, bilateral, mild stage: Secondary | ICD-10-CM | POA: Diagnosis not present

## 2016-09-04 DIAGNOSIS — H0289 Other specified disorders of eyelid: Secondary | ICD-10-CM | POA: Diagnosis not present

## 2016-11-29 ENCOUNTER — Encounter: Payer: Self-pay | Admitting: Internal Medicine

## 2016-12-07 DIAGNOSIS — Z23 Encounter for immunization: Secondary | ICD-10-CM | POA: Diagnosis not present

## 2017-03-19 DIAGNOSIS — H16223 Keratoconjunctivitis sicca, not specified as Sjogren's, bilateral: Secondary | ICD-10-CM | POA: Diagnosis not present

## 2017-03-19 DIAGNOSIS — H26491 Other secondary cataract, right eye: Secondary | ICD-10-CM | POA: Diagnosis not present

## 2017-03-19 DIAGNOSIS — H401131 Primary open-angle glaucoma, bilateral, mild stage: Secondary | ICD-10-CM | POA: Diagnosis not present

## 2017-03-19 DIAGNOSIS — H0289 Other specified disorders of eyelid: Secondary | ICD-10-CM | POA: Diagnosis not present

## 2017-03-29 DIAGNOSIS — Z85828 Personal history of other malignant neoplasm of skin: Secondary | ICD-10-CM | POA: Diagnosis not present

## 2017-03-29 DIAGNOSIS — C44319 Basal cell carcinoma of skin of other parts of face: Secondary | ICD-10-CM | POA: Diagnosis not present

## 2017-03-29 DIAGNOSIS — D485 Neoplasm of uncertain behavior of skin: Secondary | ICD-10-CM | POA: Diagnosis not present

## 2017-03-29 DIAGNOSIS — L820 Inflamed seborrheic keratosis: Secondary | ICD-10-CM | POA: Diagnosis not present

## 2017-03-29 DIAGNOSIS — L821 Other seborrheic keratosis: Secondary | ICD-10-CM | POA: Diagnosis not present

## 2017-04-17 DIAGNOSIS — Z85828 Personal history of other malignant neoplasm of skin: Secondary | ICD-10-CM | POA: Diagnosis not present

## 2017-04-17 DIAGNOSIS — C44319 Basal cell carcinoma of skin of other parts of face: Secondary | ICD-10-CM | POA: Diagnosis not present

## 2017-06-25 ENCOUNTER — Ambulatory Visit (INDEPENDENT_AMBULATORY_CARE_PROVIDER_SITE_OTHER): Payer: Medicare Other | Admitting: Internal Medicine

## 2017-06-25 ENCOUNTER — Encounter: Payer: Self-pay | Admitting: Internal Medicine

## 2017-06-25 VITALS — BP 180/80 | HR 75 | Temp 97.6°F | Wt 131.0 lb

## 2017-06-25 DIAGNOSIS — E785 Hyperlipidemia, unspecified: Secondary | ICD-10-CM

## 2017-06-25 DIAGNOSIS — M818 Other osteoporosis without current pathological fracture: Secondary | ICD-10-CM | POA: Diagnosis not present

## 2017-06-25 DIAGNOSIS — H905 Unspecified sensorineural hearing loss: Secondary | ICD-10-CM | POA: Diagnosis not present

## 2017-06-25 NOTE — Patient Instructions (Addendum)
Take a calcium supplement, plus [phone] units of vitamin D  Please check your blood pressure on a regular basis.  If it is consistently greater than 150/90, please make an office appointment.  Return in 1 year for follow-up

## 2017-06-25 NOTE — Progress Notes (Signed)
Subjective:    Patient ID: Tina Schroeder, female    DOB: 1923-12-20, 84 y.o.   MRN: [id number]  HPI 84 year old patient who is seen today for an annual follow-up and for a subsequent Medicare wellness visit She has a prior history of treated osteoporosis. She has had a fairly recent BCE and Mohs surgery from the left temporal area  Doing well today she has a history of mild dyslipidemia.  She also has a history of a carotid bruit .  Most recent carotid Doppler studies revealed no significant stenosis.  Denies any focal neurological symptoms Her only complaint is decreased visual acuity.  She is followed closely by ophthalmology.  She no longer drives due to poor vision. She is requesting referral to audiology  Past Medical History:  Diagnosis Date  . Carotid bruit    right  . Glaucoma   . OP (osteoporosis)      Social History   Socioeconomic History  . Marital status: Widowed    Spouse name: Not on file  . Number of children: Not on file  . Years of education: Not on file  . Highest education level: Not on file  Occupational History  . Not on file  Social Needs  . Financial resource strain: Not on file  . Food insecurity:    Worry: Not on file    Inability: Not on file  . Transportation needs:    Medical: Not on file    Non-medical: Not on file  Tobacco Use  . Smoking status: Never Smoker  . Smokeless tobacco: Never Used  Substance and Sexual Activity  . Alcohol use: No  . Drug use: No  . Sexual activity: Not on file  Lifestyle  . Physical activity:    Days per week: Not on file    Minutes per session: Not on file  . Stress: Not on file  Relationships  . Social connections:    Talks on phone: Not on file    Gets together: Not on file    Attends religious service: Not on file    Active member of club or organization: Not on file    Attends meetings of clubs or organizations: Not on file    Relationship status: Not on file  . Intimate partner violence:   Fear of current or ex partner: Not on file    Emotionally abused: Not on file    Physically abused: Not on file    Forced sexual activity: Not on file  Other Topics Concern  . Not on file  Social History Narrative  . Not on file    Past Surgical History:  Procedure Laterality Date  . CATARACT EXTRACTION  08/2007  . TONSILLECTOMY     childhood 1930    Family History  Problem Relation Age of Onset  . Breast cancer Daughter 64    Allergies  Allergen Reactions  . Quinine Derivatives Palpitations    Nausea, weak    Current Outpatient Medications on File Prior to Visit  Medication Sig Dispense Refill  . aspirin 81 MG tablet Take 81 mg by mouth daily.     . Calcium Citrate-Vitamin D (CALCIUM CITRATE + D3 MAXIMUM) 315-250 MG-UNIT TABS Take 2 tablets by mouth 2 (two) times daily. (Patient taking differently: Takes 2 tablets once per day. Slow release) 120 tablet   . dorzolamide-timolol (COSOPT) 22.3-6.8 MG/ML ophthalmic solution INT 1 GTT INTO OU BID FOR 30 DAYS  6  . Multiple Vitamins-Minerals (CENTRUM SILVER PO) Take 1  tablet by mouth daily.      . RESTASIS 0.05 % ophthalmic emulsion Place 1 drop into both eyes 2 (two) times daily.      No current facility-administered medications on file prior to visit.     BP (!) 180/80 (BP Location: Left Arm, Patient Position: Sitting, Cuff Size: Normal)   Pulse 75   Temp 97.6 F (36.4 C) (Oral)   Wt 131 lb (59.4 kg)   SpO2 97%   BMI 25.58 kg/m    Subsequent Medicare wellness visit  1. Risk factors, based on past  M,S,F history.  Cardiovascular risk factors include a history of mild dyslipidemia  2.  Physical activities: Remains fairly active.  Continues to live independently but does have a daughter who is close by and helps with driving and shopping  3.  Depression/mood: No history of major depression or mood disorder  4.  Hearing: Family complains of some mild deficits and request audiology evaluation.  Hearing does not appear  to be too impaired with one on one communication  5.  ADL's: Independent.  No longer drives due to visual deficits  6.  Fall risk: Moderate due to age and decreased vision  7.  Home safety: No problems identified  8.  Height weight, and visual acuity; height and weight stable visual acuity has fallen off.  Is followed closely by ophthalmology  9.  Counseling: Continue heart healthy diet  10. Lab orders based on risk factors: Laboratory update including lipid profile will be reviewed  11. Referral : Audiology referral  12. Care plan: Continue effort at aggressive risk factor modification  13. Cognitive assessment: Alert and appropriate normal affect.  No cognitive dysfunction  14. Screening: Patient provided with a written and personalized 5-10 year screening schedule in the AVS.    15. Provider List Update: Primary care ophthalmology audiology   Review of Systems  Constitutional: Positive for unexpected weight change.  HENT: Positive for hearing loss. Negative for congestion, dental problem, rhinorrhea, sinus pressure, sore throat and tinnitus.   Eyes: Positive for visual disturbance. Negative for pain and discharge.  Respiratory: Negative for cough and shortness of breath.   Cardiovascular: Negative for chest pain, palpitations and leg swelling.  Gastrointestinal: Negative for abdominal distention, abdominal pain, blood in stool, constipation, diarrhea, nausea and vomiting.  Genitourinary: Negative for difficulty urinating, dysuria, flank pain, frequency, hematuria, pelvic pain, urgency, vaginal bleeding, vaginal discharge and vaginal pain.  Musculoskeletal: Negative for arthralgias, gait problem and joint swelling.  Skin: Negative for rash.  Neurological: Negative for dizziness, syncope, speech difficulty, weakness, numbness and headaches.  Hematological: Negative for adenopathy.  Psychiatric/Behavioral: Negative for agitation, behavioral problems and dysphoric mood. The  patient is not nervous/anxious.        Objective:   Physical Exam  Constitutional: She is oriented to person, place, and time. She appears well-developed and well-nourished.  Blood pressure 130/78  HENT:  Head: Normocephalic.  Right Ear: External ear normal.  Left Ear: External ear normal.  Mouth/Throat: Oropharynx is clear and moist.  Eyes: Pupils are equal, round, and reactive to light. Conjunctivae and EOM are normal.  Neck: Normal range of motion. Neck supple. No thyromegaly present.  No carotid bruits appreciated  Cardiovascular: Normal rate, regular rhythm, normal heart sounds and intact distal pulses.  Dorsalis pedis pulses +1 posterior tibial pulses +3  Pulmonary/Chest: Effort normal and breath sounds normal.  Kyphosis  Abdominal: Soft. Bowel sounds are normal. She exhibits no mass. There is no tenderness.  Musculoskeletal:  Normal range of motion.  Lymphadenopathy:    She has no cervical adenopathy.  Neurological: She is alert and oriented to person, place, and time.  Skin: Skin is warm and dry. No rash noted.  Feet cool to touch but pedal pulses intact  Psychiatric: She has a normal mood and affect. Her behavior is normal.          Assessment & Plan:   Osteoporosis.  Will continue calcium and vitamin D supplements Mild dyslipidemia.  Will review a lipid profile Subsequent Medicare wellness visit  Review updated lab follow-up 1 year or as needed  Nyoka Cowden

## 2017-06-26 LAB — CBC WITH DIFFERENTIAL/PLATELET
BASOS ABS: 0 10*3/uL (ref 0.0–0.1)
Basophils Relative: 0.5 % (ref 0.0–3.0)
EOS ABS: 0.1 10*3/uL (ref 0.0–0.7)
Eosinophils Relative: 1.5 % (ref 0.0–5.0)
HEMATOCRIT: 42 % (ref 36.0–46.0)
HEMOGLOBIN: 13.9 g/dL (ref 12.0–15.0)
LYMPHS PCT: 32.8 % (ref 12.0–46.0)
Lymphs Abs: 1.5 10*3/uL (ref 0.7–4.0)
MCHC: 33.2 g/dL (ref 30.0–36.0)
MCV: 93.1 fl (ref 78.0–100.0)
Monocytes Absolute: 0.4 10*3/uL (ref 0.1–1.0)
Monocytes Relative: 8.4 % (ref 3.0–12.0)
Neutro Abs: 2.5 10*3/uL (ref 1.4–7.7)
Neutrophils Relative %: 56.8 % (ref 43.0–77.0)
Platelets: 288 10*3/uL (ref 150.0–400.0)
RBC: 4.51 Mil/uL (ref 3.87–5.11)
RDW: 14.1 % (ref 11.5–15.5)
WBC: 4.5 10*3/uL (ref 4.0–10.5)

## 2017-06-26 LAB — LIPID PANEL
CHOL/HDL RATIO: 3
Cholesterol: 235 mg/dL — ABNORMAL HIGH (ref 0–200)
HDL: 81 mg/dL (ref >39.00)
LDL CALC: 134 mg/dL — AB (ref 0–99)
NonHDL: 153.83
TRIGLYCERIDES: 100 mg/dL (ref 0.0–149.0)
VLDL: 20 mg/dL (ref 0.0–40.0)

## 2017-06-26 LAB — COMPREHENSIVE METABOLIC PANEL
ALBUMIN: 4.2 g/dL (ref 3.5–5.2)
ALK PHOS: 75 U/L (ref 39–117)
ALT: 14 U/L (ref 0–35)
AST: 20 U/L (ref 0–37)
BUN: 21 mg/dL (ref 6–23)
CALCIUM: 9.8 mg/dL (ref 8.4–10.5)
CHLORIDE: 104 meq/L (ref 96–112)
CO2: 28 mEq/L (ref 19–32)
CREATININE: 0.79 mg/dL (ref 0.40–1.20)
GFR: 72.34 mL/min (ref >60.00)
Glucose, Bld: 90 mg/dL (ref 70–99)
Potassium: 4.7 mEq/L (ref 3.5–5.1)
Sodium: 140 mEq/L (ref 135–145)
Total Bilirubin: 0.8 mg/dL (ref 0.2–1.2)
Total Protein: 6.9 g/dL (ref 6.0–8.3)

## 2017-06-26 LAB — TSH: TSH: 1.55 u[IU]/mL (ref 0.35–4.50)

## 2017-09-11 ENCOUNTER — Telehealth: Payer: Self-pay

## 2017-09-11 NOTE — Telephone Encounter (Signed)
Copied from [place] [id number]. Topic: Inquiry >> Sep 09, 2017  8:36 AM [name] wrote: Reason for CRM: Patient is currently a patient of Dr. Raliegh Ip at [facility]. Since he is retiring, patient would like for Dr. Yong Channel to become her PCP. Patient wants to know if Dr. Yong Channel would consider accepting the patient as a New Pt/Transfer of Care. Please call patient and schedule if Dr. Yong Channel approves to accept this patient.       Thank You!!!  >> Sep 09, 2017  8:48 AM [name] wrote: Please refer patient to another provider. Patient has medicare. Thank you >> Sep 10, 2017  3:04 PM Self, [name] wrote: Tina Schroeder, I called this patient and let her know that Dr. Ansel Bong medicare panel is full and so I suggested some other providers. We ended the conversation with the patient stating she would look into the providers and get back to [place] if she still decides to establish at [facility]. She mentioned that her son in law is a patient of Dr. [name]- are you still accepting Medicare patients with referrals from family?

## 2017-09-11 NOTE — Telephone Encounter (Signed)
I will accept since she has a family member here. Eventually I may close panel to those referrals as well. THis should not be a rush to get her in though- perhaps right around his retirement.

## 2017-09-11 NOTE — Telephone Encounter (Signed)
See note

## 2017-09-11 NOTE — Telephone Encounter (Signed)
Patient will call back to schedule. CRM in place.

## 2017-09-16 ENCOUNTER — Other Ambulatory Visit: Payer: Self-pay | Admitting: Internal Medicine

## 2017-09-16 DIAGNOSIS — Z1231 Encounter for screening mammogram for malignant neoplasm of breast: Secondary | ICD-10-CM

## 2017-09-24 DIAGNOSIS — H18413 Arcus senilis, bilateral: Secondary | ICD-10-CM | POA: Diagnosis not present

## 2017-09-24 DIAGNOSIS — H02839 Dermatochalasis of unspecified eye, unspecified eyelid: Secondary | ICD-10-CM | POA: Diagnosis not present

## 2017-09-24 DIAGNOSIS — H409 Unspecified glaucoma: Secondary | ICD-10-CM | POA: Diagnosis not present

## 2017-09-24 DIAGNOSIS — H401131 Primary open-angle glaucoma, bilateral, mild stage: Secondary | ICD-10-CM | POA: Diagnosis not present

## 2017-10-04 ENCOUNTER — Ambulatory Visit
Admission: RE | Admit: 2017-10-04 | Discharge: 2017-10-04 | Disposition: A | Discharge status: Home / Self Care | Payer: Medicare Other | Source: Ambulatory Visit | Attending: Internal Medicine | Admitting: Internal Medicine

## 2017-10-04 DIAGNOSIS — Z1231 Encounter for screening mammogram for malignant neoplasm of breast: Secondary | ICD-10-CM | POA: Diagnosis not present

## 2017-12-26 DIAGNOSIS — Z23 Encounter for immunization: Secondary | ICD-10-CM | POA: Diagnosis not present

## 2018-02-18 DIAGNOSIS — Z961 Presence of intraocular lens: Secondary | ICD-10-CM | POA: Diagnosis not present

## 2018-02-18 DIAGNOSIS — H02831 Dermatochalasis of right upper eyelid: Secondary | ICD-10-CM | POA: Diagnosis not present

## 2018-02-18 DIAGNOSIS — H16223 Keratoconjunctivitis sicca, not specified as Sjogren's, bilateral: Secondary | ICD-10-CM | POA: Diagnosis not present

## 2018-02-18 DIAGNOSIS — H401131 Primary open-angle glaucoma, bilateral, mild stage: Secondary | ICD-10-CM | POA: Diagnosis not present

## 2018-05-22 ENCOUNTER — Encounter: Payer: Self-pay | Admitting: Family Medicine

## 2018-05-22 ENCOUNTER — Other Ambulatory Visit: Payer: Self-pay

## 2018-05-22 ENCOUNTER — Ambulatory Visit (INDEPENDENT_AMBULATORY_CARE_PROVIDER_SITE_OTHER): Payer: Medicare Other | Admitting: Family Medicine

## 2018-05-22 VITALS — BP 140/80 | HR 65 | Temp 97.9°F | Ht 60.0 in | Wt 135.0 lb

## 2018-05-22 DIAGNOSIS — M818 Other osteoporosis without current pathological fracture: Secondary | ICD-10-CM | POA: Diagnosis not present

## 2018-05-22 DIAGNOSIS — R0989 Other specified symptoms and signs involving the circulatory and respiratory systems: Secondary | ICD-10-CM

## 2018-05-22 DIAGNOSIS — E785 Hyperlipidemia, unspecified: Secondary | ICD-10-CM | POA: Diagnosis not present

## 2018-05-22 DIAGNOSIS — R030 Elevated blood-pressure reading, without diagnosis of hypertension: Secondary | ICD-10-CM

## 2018-05-22 DIAGNOSIS — H409 Unspecified glaucoma: Secondary | ICD-10-CM

## 2018-05-22 NOTE — Progress Notes (Signed)
Phone: [phone]   Subjective:  Patient presents today to establish care with me as their new primary care provider. Patient was formerly a patient of Dr. Raliegh Ip. Chief complaint-noted.   See problem oriented charting ROS- No chest pain or shortness of breath. No headache. Stable blurry vision (working with optho)  The following were reviewed and entered/updated in epic: Past Medical History:  Diagnosis Date  . Carotid bruit    right  . Glaucoma   . OP (osteoporosis)    Patient Active Problem List   Diagnosis Date Noted  . Osteoporosis 11/18/2006    Priority: High  . Dyslipidemia 07/08/2013    Priority: Medium  . Right carotid bruit 10/09/2007    Priority: Low  . White coat syndrome without hypertension 05/24/2018  . Glaucoma 05/24/2018   Past Surgical History:  Procedure Laterality Date  . basal cell skin cancer removal under local    . CATARACT EXTRACTION  08/2007  . TONSILLECTOMY     childhood Jun 09, 1928    Family History  Problem Relation Age of Onset  . Breast cancer Daughter 30    Medications- reviewed and updated Current Outpatient Medications  Medication Sig Dispense Refill  . aspirin EC 81 MG tablet Take 81 mg by mouth once a week.    . Calcium Citrate-Vitamin D (CALCIUM CITRATE + D3 MAXIMUM) 315-250 MG-UNIT TABS Take 2 tablets by mouth 2 (two) times daily. (Patient taking differently: Takes 2 tablets once per day. Slow release) 120 tablet   . dorzolamide-timolol (COSOPT) 22.3-6.8 MG/ML ophthalmic solution INT 1 GTT INTO OU BID FOR 30 DAYS  6  . Multiple Vitamins-Minerals (CENTRUM SILVER PO) Take 1 tablet by mouth daily.      . RESTASIS 0.05 % ophthalmic emulsion Place 1 drop into both eyes 2 (two) times daily.      No current facility-administered medications for this visit.     Allergies-reviewed and updated Allergies  Allergen Reactions  . Quinine Derivatives Palpitations    Nausea, weak    Social History   Social History Narrative   Widowed-  husband died 08-Jun-1984 (was a Psychologist, sport and exercise- had brain tumor). She cared for him until his death.       Retired from Designer, industrial/product: reading, watch tv, jigsaw puzzles   Objective  Objective:  BP 140/80 (BP Location: Left Arm, Patient Position: Sitting, Cuff Size: Normal)   Pulse 65   Temp 97.9 F (36.6 C) (Oral)   Ht 5' (1.524 m)   Wt 135 lb (61.2 kg)   SpO2 98%   BMI 26.37 kg/m  Gen: NAD, resting comfortably, appears younger than stated age HEENT: Mucous membranes are moist. Oropharynx normal Neck: no thyromegaly or cervical lymphadenopathy.  Faint right carotid bruit CV: RRR no murmurs rubs or gallops Lungs: CTAB no crackles, wheeze, rhonchi Abdomen: soft/nontender/nondistended/normal bowel sounds. No rebound or guarding.  Ext: no edema Skin: warm, dry Neuro: grossly normal, moves all extremities, PERRLA   Assessment and Plan:   Dyslipidemia S: Poorly controlled on no medication Lab Results  Component Value Date   CHOL 235 (H) 06/25/2017   HDL 81.00 06/25/2017   LDLCALC 134 (H) 06/25/2017   LDLDIRECT 140.5 09/07/2010   TRIG 100.0 06/25/2017   CHOLHDL 3 06/25/2017   A/P: No clear indication to start statin at age 74 without history of heart attack or stroke.  Patient does have some mild carotid stenosis though under 40%-I do not feel strongly about statin at her  age.  She also has a tortuous carotid.  Dr. Raliegh Ip for this reason had opted to continue aspirin-we discussed potential bleeding risk and opted to move aspirin to once a week to try to balance stroke reduction and bleeding risk    Right carotid bruit S:<40% stenosis right in March 2018.  Carotid artery is tortuous A/P: I do hear a faint bruit today.  I do not feel strongly about regular follow-up of the carotid arteries unless patient is symptomatic.  We opted to remain off of statin and reduce aspirin to once a week-to reduce bleeding risk     Osteoporosis S:Takes calium/vitamin D. Does not want to start fosamax  again- took 5 years in the past. 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  A/P: Patient very concerned about potential side effects-she does not want to take bisphosphonates again.  For that reason I believe repeating bone density would also be low yield.  White coat syndrome without hypertension S: Patient reports history of elevated blood pressures in the office in the past.  She is a retired Marine scientist.  She has checked blood pressure in the past and was not elevated A/P: Only mild elevation in the office today anyway-we opted to continue to monitor only.  No medications indicated at this time.  Glaucoma Glaucoma- under control with Dr. Tommy Rainwater. Stopped driving a few years ago due to blurred vision - question dry eye as cause- this is been difficult for her  Other notes: 1.Was in [place] since December 28th but no sick contacts- came back Saturday. Area she was in without known covid 19.     Return in about 1 year (around 05/22/2019) for follow up- or sooner if needed.  Lab/Order associations: Dyslipidemia  Right carotid bruit  Other osteoporosis, unspecified pathological fracture presence  White coat syndrome without hypertension  Glaucoma, unspecified glaucoma type, unspecified laterality  Return precautions advised.  Garret Reddish, MD

## 2018-05-22 NOTE — Patient Instructions (Addendum)
You amaze me! You are doing awesome  We opted to reduce aspirin to once a week-  We did this considering  given the tortuous/twisty caroitd artery but also wanting to reduce bleeding risk  You had basically perfect labs last year other than slightly high cholesterol and at 89 - I dont feel strongly about cholesterol medicine with no history of heart attack or stroke - we opted to repeat labs in 1 year at your follow up (if you decide you want to do in 6 months we can also do that)  I really want you to try to stay out of medical offices as much as possible for the next few months until things calm down with coronavirus. Try to stay at home mostly honestly for now- hopefully family can help you with supplies

## 2018-05-24 DIAGNOSIS — R030 Elevated blood-pressure reading, without diagnosis of hypertension: Secondary | ICD-10-CM | POA: Insufficient documentation

## 2018-05-24 DIAGNOSIS — H409 Unspecified glaucoma: Secondary | ICD-10-CM | POA: Insufficient documentation

## 2018-05-24 NOTE — Assessment & Plan Note (Signed)
S: Poorly controlled on no medication Lab Results  Component Value Date   CHOL 235 (H) 06/25/2017   HDL 81.00 06/25/2017   LDLCALC 134 (H) 06/25/2017   LDLDIRECT 140.5 09/07/2010   TRIG 100.0 06/25/2017   CHOLHDL 3 06/25/2017   A/P: No clear indication to start statin at age 85 without history of heart attack or stroke.  Patient does have some mild carotid stenosis though under 40%-I do not feel strongly about statin at her age.  She also has a tortuous carotid.  Dr. Raliegh Ip for this reason had opted to continue aspirin-we discussed potential bleeding risk and opted to move aspirin to once a week to try to balance stroke reduction and bleeding risk

## 2018-05-24 NOTE — Assessment & Plan Note (Signed)
S:Takes calium/vitamin D. Does not want to start fosamax again- took 5 years in the past. 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  A/P: Patient very concerned about potential side effects-she does not want to take bisphosphonates again.  For that reason I believe repeating bone density would also be low yield.

## 2018-05-24 NOTE — Assessment & Plan Note (Addendum)
S:<40% stenosis right in March 2018.  Carotid artery is tortuous A/P: I do hear a faint bruit today.  I do not feel strongly about regular follow-up of the carotid arteries unless patient is symptomatic.  We opted to remain off of statin and reduce aspirin to once a week-to reduce bleeding risk

## 2018-05-24 NOTE — Assessment & Plan Note (Signed)
S: Patient reports history of elevated blood pressures in the office in the past.  She is a retired Marine scientist.  She has checked blood pressure in the past and was not elevated A/P: Only mild elevation in the office today anyway-we opted to continue to monitor only.  No medications indicated at this time.

## 2018-05-24 NOTE — Assessment & Plan Note (Signed)
Glaucoma- under control with Dr. Tommy Rainwater. Stopped driving a few years ago due to blurred vision - question dry eye as cause- this is been difficult for her

## 2018-05-28 ENCOUNTER — Telehealth: Payer: Self-pay | Admitting: Family Medicine

## 2018-05-28 NOTE — Telephone Encounter (Signed)
[email or web address]   This is a good group

## 2018-05-28 NOTE — Telephone Encounter (Signed)
Has she tried things like over-the-counter Tinactin or Lamisil if it is on her skin?  If it is on her toenails-may require oral medication-we could treat her for that.    If she really wants a referral to podiatry-you can send over for that

## 2018-05-28 NOTE — Telephone Encounter (Signed)
Pt is wanting to see a podiatrist. She stated that before she moved her years ago, she had this issue and was given Tinactin and was told to do OTC medication after that. Pt stated the issue has not gotten better. Pt also stated that she does not like taking oral medications so she declined the Rx. The fungus is on the pt right toenail. Patient stated she wants to wait until the Covid-19 settles down before scheduling an appt. Pt claims that the issue can wait until 3 or 4 months.   Pt is just seeing if Dr. Yong Channel has a recommendation for a good Podiatrist. Pt is  Wanting the name so that she can call when she wants to set up an appt. Pt was advised that she may need a referral and to call us back if she does to place it. Pt verbalized understanding.    Please advise

## 2018-05-28 NOTE — Telephone Encounter (Signed)
Pt wanted to know if you had any recommendations of a good Podiatrists  Please advise

## 2018-05-28 NOTE — Telephone Encounter (Signed)
Yes thanks-may refer to podiatry

## 2018-05-28 NOTE — Telephone Encounter (Signed)
Please advise Okay for referral or does pt needs an ov?

## 2018-05-28 NOTE — Telephone Encounter (Signed)
See note  Copied from [place] [id number]. Topic: Referral - Request for Referral >> May 28, 2018  1:24 PM Lennox Solders wrote: Has patient seen PCP for this complaint? No. Pt forgot to ask dr hunter for a referral when she saw him on 05-22-2018. Pt has medicare and aarp secondary. Pt has on her right foot fungus infection . Pt would like dr hunter recommendation for podiatrist

## 2018-05-29 NOTE — Telephone Encounter (Signed)
Gave patient advice and she has verbalized an understanding-patient will wait for a while before going to [facility] due to Covid-19

## 2018-10-09 ENCOUNTER — Other Ambulatory Visit: Payer: Self-pay

## 2018-11-10 ENCOUNTER — Other Ambulatory Visit: Payer: Self-pay | Admitting: Family Medicine

## 2018-11-10 DIAGNOSIS — Z1231 Encounter for screening mammogram for malignant neoplasm of breast: Secondary | ICD-10-CM

## 2018-12-30 ENCOUNTER — Ambulatory Visit
Admission: RE | Admit: 2018-12-30 | Discharge: 2018-12-30 | Disposition: A | Discharge status: Home / Self Care | Payer: Medicare Other | Source: Ambulatory Visit | Attending: Family Medicine | Admitting: Family Medicine

## 2018-12-30 ENCOUNTER — Other Ambulatory Visit: Payer: Self-pay

## 2018-12-30 DIAGNOSIS — Z1231 Encounter for screening mammogram for malignant neoplasm of breast: Secondary | ICD-10-CM

## 2018-12-30 DIAGNOSIS — H18413 Arcus senilis, bilateral: Secondary | ICD-10-CM | POA: Diagnosis not present

## 2018-12-30 DIAGNOSIS — H401131 Primary open-angle glaucoma, bilateral, mild stage: Secondary | ICD-10-CM | POA: Diagnosis not present

## 2018-12-30 DIAGNOSIS — H16223 Keratoconjunctivitis sicca, not specified as Sjogren's, bilateral: Secondary | ICD-10-CM | POA: Diagnosis not present

## 2018-12-30 DIAGNOSIS — Z961 Presence of intraocular lens: Secondary | ICD-10-CM | POA: Diagnosis not present

## 2018-12-31 ENCOUNTER — Telehealth: Payer: Self-pay | Admitting: Family Medicine

## 2018-12-31 DIAGNOSIS — Z23 Encounter for immunization: Secondary | ICD-10-CM | POA: Diagnosis not present

## 2018-12-31 NOTE — Telephone Encounter (Signed)
I called the patient to schedule AWV with Loma Sousa.  She prefers to schedule it in March/April 2021 when she comes back to see Dr. Yong Channel. VDM (Dee-Dee)

## 2019-04-20 DIAGNOSIS — L570 Actinic keratosis: Secondary | ICD-10-CM | POA: Diagnosis not present

## 2019-04-20 DIAGNOSIS — L821 Other seborrheic keratosis: Secondary | ICD-10-CM | POA: Diagnosis not present

## 2019-04-20 DIAGNOSIS — Z85828 Personal history of other malignant neoplasm of skin: Secondary | ICD-10-CM | POA: Diagnosis not present

## 2019-04-20 DIAGNOSIS — L820 Inflamed seborrheic keratosis: Secondary | ICD-10-CM | POA: Diagnosis not present

## 2019-04-21 ENCOUNTER — Encounter: Payer: Self-pay | Admitting: Podiatry

## 2019-04-21 ENCOUNTER — Other Ambulatory Visit: Payer: Self-pay

## 2019-04-21 ENCOUNTER — Ambulatory Visit (INDEPENDENT_AMBULATORY_CARE_PROVIDER_SITE_OTHER): Payer: Medicare Other | Admitting: Podiatry

## 2019-04-21 DIAGNOSIS — L603 Nail dystrophy: Secondary | ICD-10-CM

## 2019-04-21 DIAGNOSIS — B351 Tinea unguium: Secondary | ICD-10-CM | POA: Diagnosis not present

## 2019-04-21 NOTE — Progress Notes (Signed)
  Subjective:  Patient ID: Tina Schroeder, female    DOB: 1923/04/24,  MRN: [id number] HPI Chief Complaint  Patient presents with  . Nail Problem    Toenails bilateral - thick and dark nails x 50 years, has been on Penlac in the past, now using OTC antifungal  . New Patient (Initial Visit)    86 y.o. female presents with the above complaint.   ROS: Denies fever chills nausea vomiting muscle aches pains calf pain back pain chest pain shortness of breath.  Past Medical History:  Diagnosis Date  . Carotid bruit    right  . Glaucoma   . OP (osteoporosis)    Past Surgical History:  Procedure Laterality Date  . basal cell skin cancer removal under local    . CATARACT EXTRACTION  08/2007  . TONSILLECTOMY     childhood 1930    Current Outpatient Medications:  .  aspirin EC 81 MG tablet, Take 81 mg by mouth once a week., Disp: , Rfl:  .  dorzolamide-timolol (COSOPT) 22.3-6.8 MG/ML ophthalmic solution, INT 1 GTT INTO OU BID FOR 30 DAYS, Disp: , Rfl: 6 .  RESTASIS 0.05 % ophthalmic emulsion, Place 1 drop into both eyes 2 (two) times daily. , Disp: , Rfl:   Allergies  Allergen Reactions  . Quinine Derivatives Palpitations    Nausea, weak   Review of Systems Objective:  There were no vitals filed for this visit.  General: Well developed, nourished, in no acute distress, alert and oriented x3   Dermatological: Skin is warm, dry and supple bilateral. Nails x 10 are well maintained; remaining integument appears unremarkable at this time. There are no open sores, no preulcerative lesions, no rash or signs of infection present.  Toenails are thick yellow dystrophic-like mycotic.  Probably dystrophic.  Vascular: Dorsalis Pedis artery and Posterior Tibial artery pedal pulses are 2/4 bilateral with immedate capillary fill time. Pedal hair growth present. No varicosities and no lower extremity edema present bilateral.   Neruologic: Grossly intact via light touch bilateral. Vibratory intact via  tuning fork bilateral. Protective threshold with Semmes Wienstein monofilament intact to all pedal sites bilateral. Patellar and Achilles deep tendon reflexes 2+ bilateral. No Babinski or clonus noted bilateral.   Musculoskeletal: No gross boney pedal deformities bilateral. No pain, crepitus, or limitation noted with foot and ankle range of motion bilateral. Muscular strength 5/5 in all groups tested bilateral.  Gait: Unassisted, Nonantalgic.    Radiographs:  None taken  Assessment & Plan:   Assessment: Nail dystrophy hallux bilaterally and lesser digits 2  Plan: Samples of the nail were taken today for pathologic evaluation we will follow-up with her in 1 month     Kalub Morillo T. [place], [place]

## 2019-05-07 DIAGNOSIS — H401131 Primary open-angle glaucoma, bilateral, mild stage: Secondary | ICD-10-CM | POA: Diagnosis not present

## 2019-05-07 DIAGNOSIS — H16223 Keratoconjunctivitis sicca, not specified as Sjogren's, bilateral: Secondary | ICD-10-CM | POA: Diagnosis not present

## 2019-05-07 DIAGNOSIS — Z961 Presence of intraocular lens: Secondary | ICD-10-CM | POA: Diagnosis not present

## 2019-05-25 ENCOUNTER — Ambulatory Visit (INDEPENDENT_AMBULATORY_CARE_PROVIDER_SITE_OTHER): Payer: Medicare Other

## 2019-05-25 ENCOUNTER — Other Ambulatory Visit: Payer: Self-pay

## 2019-05-25 DIAGNOSIS — Z0000 Encounter for general adult medical examination without abnormal findings: Secondary | ICD-10-CM | POA: Diagnosis not present

## 2019-05-25 NOTE — Progress Notes (Signed)
This visit is being conducted via phone call due to the COVID-19 pandemic. This patient has given me verbal consent via phone to conduct this visit, patient states they are participating from their home address. Some vital signs may be absent or patient reported.   Patient identification: identified by name, DOB, and current address.  Location provider: [facility], Office Persons participating in the virtual visit: Denman George LPN and Lisette Abu LPN, patient, and Dr. Garret Reddish  Subjective:   Tina Schroeder is a 86 y.o. female who presents for Medicare Annual (Subsequent) preventive examination.  Review of Systems:   Cardiac Risk Factors include: advanced age (>59men, >68 women)    Objective:     Vitals: There were no vitals taken for this visit.  There is no height or weight on file to calculate BMI.  Advanced Directives 05/25/2019  Does Patient Have a Medical Advance Directive? Yes  Type of Advance Directive Living will;Healthcare Power of Attorney  Does patient want to make changes to medical advance directive? No - Patient declined  Copy of [place] in Chart? No - copy requested    Tobacco Social History   Tobacco Use  Smoking Status Never Smoker  Smokeless Tobacco Never Used     Counseling given: Not Answered   Clinical Intake:  Pre-visit preparation completed: Yes  Pain : No/denies pain  Diabetes: No  How often do you need to have someone help you when you read instructions, pamphlets, or other written materials from your doctor or pharmacy?: 1 - Never  Interpreter Needed?: No  Information entered by :: Denman George LPN  Past Medical History:  Diagnosis Date  . Carotid bruit    right  . Glaucoma   . OP (osteoporosis)    Past Surgical History:  Procedure Laterality Date  . basal cell skin cancer removal under local    . CATARACT EXTRACTION  08/2007  . TONSILLECTOMY     childhood 1930   Family History  Problem  Relation Age of Onset  . Breast cancer Daughter 49   Social History   Socioeconomic History  . Marital status: Widowed    Spouse name: Not on file  . Number of children: Not on file  . Years of education: Not on file  . Highest education level: Not on file  Occupational History  . Not on file  Tobacco Use  . Smoking status: Never Smoker  . Smokeless tobacco: Never Used  Substance and Sexual Activity  . Alcohol use: No    Comment: rare social  . Drug use: No  . Sexual activity: Not on file  Other Topics Concern  . Not on file  Social History Narrative   Widowed- husband died 26 (was a Psychologist, sport and exercise- had brain tumor). She cared for him until his death.       Retired from Designer, industrial/product: reading, watch tv, jigsaw puzzles   Social Determinants of Health   Financial Resource Strain:   . Difficulty of Paying Living Expenses:   Food Insecurity:   . Worried About Charity fundraiser in the Last Year:   . Arboriculturist in the Last Year:   Transportation Needs:   . Film/video editor (Medical):   Marland Kitchen Lack of Transportation (Non-Medical):   Physical Activity:   . Days of Exercise per Week:   . Minutes of Exercise per Session:   Stress:   . Feeling of Stress :  Social Connections:   . Frequency of Communication with Friends and Family:   . Frequency of Social Gatherings with Friends and Family:   . Attends Religious Services:   . Active Member of Clubs or Organizations:   . Attends Archivist Meetings:   Marland Kitchen Marital Status:     Outpatient Encounter Medications as of 05/25/2019  Medication Sig  . aspirin EC 81 MG tablet Take 81 mg by mouth once a week.  . dorzolamide-timolol (COSOPT) 22.3-6.8 MG/ML ophthalmic solution INT 1 GTT INTO OU BID FOR 30 DAYS  . RESTASIS 0.05 % ophthalmic emulsion Place 1 drop into both eyes 2 (two) times daily.    No facility-administered encounter medications on file as of 05/25/2019.    Activities of Daily Living In your  present state of health, do you have any difficulty performing the following activities: 05/25/2019  Hearing? N  Vision? N  Difficulty concentrating or making decisions? N  Walking or climbing stairs? N  Dressing or bathing? N  Doing errands, shopping? N  Preparing Food and eating ? N  Using the Toilet? N  In the past six months, have you accidently leaked urine? N  Do you have problems with loss of bowel control? N  Managing your Medications? N  Managing your Finances? N  Housekeeping or managing your Housekeeping? N  Some recent data might be hidden    Patient Care Team: Marin Olp, MD as PCP - General (Family Medicine) Darleen Crocker, MD as Consulting Physician (Ophthalmology) Garrel Ridgel, [place] as Consulting Physician (Podiatry) Jarome Matin, MD as Consulting Physician (Dermatology)    Assessment:   This is a routine wellness examination for Tina Schroeder.  Exercise Activities and Dietary recommendations Current Exercise Habits: The patient does not participate in regular exercise at present  Goals   None     Fall Risk Fall Risk  05/25/2019 10/09/2018 06/25/2017 01/10/2016 12/07/2014  Falls in the past year? 0 0 No No No  Comment - Emmi Telephone Survey: data to providers prior to load - - -  Number falls in past yr: 0 - - - -  Injury with Fall? 0 - - - -  Follow up Falls evaluation completed;Education provided;Falls prevention discussed - - - -   Is the patient's home free of loose throw rugs in walkways, pet beds, electrical cords, etc?   yes      Grab bars in the bathroom? yes      Handrails on the stairs?   yes      Adequate lighting?   yes  Depression Screen PHQ 2/9 Scores 05/25/2019 05/22/2018 06/25/2017 01/10/2016  PHQ - 2 Score 0 0 0 0     Cognitive Function     6CIT Screen 05/25/2019  What Year? 0 points  What month? 0 points  What time? 0 points  Count back from 20 0 points  Months in reverse 0 points  Repeat phrase 0 points  Total Score 0     Immunization History  Administered Date(s) Administered  . Influenza Split 01/03/2011, 06/24/2012  . Influenza Whole 12/14/2009  . Influenza, High Dose Seasonal PF 11/20/2014, 12/09/2015, 12/07/2016, 12/26/2017  . Influenza-Unspecified 01/24/2014, 12/10/2017, 01/03/2018  . Janssen (J&J) SARS-COV-2 Vaccination 05/23/2019  . Pneumococcal Conjugate-13 07/08/2013  . Pneumococcal Polysaccharide-23 03/12/2002  . Td 01/10/2009  . Zoster 10/24/2015    Qualifies for Shingles Vaccine?Discussed and patient will check with pharmacy for coverage.  Patient education handout provided   Screening Tests Health Maintenance  Topic  Date Due  . TETANUS/TDAP  01/11/2019  . INFLUENZA VACCINE  Completed  . DEXA SCAN  Completed  . PNA vac Low Risk Adult  Completed    Cancer Screenings: Lung: Low Dose CT Chest recommended if Age 1-80 years, 30 pack-year currently smoking OR have quit w/in 15years. Patient does not qualify. Breast:  Up to date on Mammogram? Yes   Up to date of Bone Density/Dexa? Yes Colorectal: No longer indicated     Plan:  I have personally reviewed and addressed the Medicare Annual Wellness questionnaire and have noted the following in the patient's chart:  A. Medical and social history B. Use of alcohol, tobacco or illicit drugs  C. Current medications and supplements D. Functional ability and status E.  Nutritional status F.  Physical activity G. Advance directives H. List of other physicians I.  Hospitalizations, surgeries, and ER visits in previous 12 months J.  [place] such as hearing and vision if needed, cognitive and depression L. Referrals, records requested, and appointments- none   In addition, I have reviewed and discussed with patient certain preventive protocols, quality metrics, and best practice recommendations. A written personalized care plan for preventive services as well as general preventive health recommendations were provided to  patient.   Signed,  Denman George, LPN  Nurse Health Advisor   Nurse Notes: Patient has completed Covid vaccine

## 2019-05-25 NOTE — Patient Instructions (Signed)
Tina Schroeder , Thank you for taking time to come for your Medicare Wellness Visit. I appreciate your ongoing commitment to your health goals. Please review the following plan we discussed and let me know if I can assist you in the future.   Screening recommendations/referrals: Colorectal Screening: up to date; last colonoscopy 2011 (no longer indicated)  Mammogram: up to date; last 12/30/18 Bone Density: up to date; last 09/16/13  Vision and Dental Exams: Recommended annual ophthalmology exams for early detection of glaucoma and other disorders of the eye Recommended annual dental exams for proper oral hygiene  Vaccinations: Influenza vaccine: completed 12/31/18 Pneumococcal vaccine: up to date; last 07/08/13 (Prevnar)  Tdap vaccine: recommended every 10 years; last 01/2009 Please call your insurance company to determine your out of pocket expense. You may receive this vaccine at your local pharmacy or Health Dept. Shingles vaccine: You may receive this vaccine at your local pharmacy. (see handout)   Advanced directives: Please bring a copy of your POA (Power of Attorney) and/or Living Will to your next appointment.  Goals: Recommend to drink at least 6-8 8oz glasses of water per day and consume a balanced diet rich in fresh fruits and vegetables.   Next appointment: Please schedule your Annual Wellness Visit with your Nurse Health Advisor in one year.  Preventive Care 40-64 Years, Female Preventive care refers to lifestyle choices and visits with your health care provider that can promote health and wellness. What does preventive care include?  A yearly physical exam. This is also called an annual well check.  Dental exams once or twice a year.  Routine eye exams. Ask your health care provider how often you should have your eyes checked.  Personal lifestyle choices, including:  Daily care of your teeth and gums.  Regular physical activity.  Eating a healthy diet.  Avoiding  tobacco and drug use.  Limiting alcohol use.  Practicing safe sex.  Taking low-dose aspirin daily starting at age 31 if recommended by your health care provider.  Taking vitamin and mineral supplements as recommended by your health care provider. What happens during an annual well check? The services and screenings done by your health care provider during your annual well check will depend on your age, overall health, lifestyle risk factors, and family history of disease. Counseling  Your health care provider may ask you questions about your:  Alcohol use.  Tobacco use.  Drug use.  Emotional well-being.  Home and relationship well-being.  Sexual activity.  Eating habits.  Work and work Statistician.  Method of birth control.  Menstrual cycle.  Pregnancy history. Screening  You may have the following tests or measurements:  Height, weight, and BMI.  Blood pressure.  Lipid and cholesterol levels. These may be checked every 5 years, or more frequently if you are over 53 years old.  Skin check.  Lung cancer screening. You may have this screening every year starting at age 87 if you have a 30-pack-year history of smoking and currently smoke or have quit within the past 15 years.  Fecal occult blood test (FOBT) of the stool. You may have this test every year starting at age 43.  Flexible sigmoidoscopy or colonoscopy. You may have a sigmoidoscopy every 5 years or a colonoscopy every 10 years starting at age 62.  Hepatitis C blood test.  Hepatitis B blood test.  Sexually transmitted disease (STD) testing.  Diabetes screening. This is done by checking your blood sugar (glucose) after you have not eaten for  a while (fasting). You may have this done every 1-3 years.  Mammogram. This may be done every 1-2 years. Talk to your health care provider about when you should start having regular mammograms. This may depend on whether you have a family history of breast  cancer.  BRCA-related cancer screening. This may be done if you have a family history of breast, ovarian, tubal, or peritoneal cancers.  Pelvic exam and Pap test. This may be done every 3 years starting at age 40. Starting at age 39, this may be done every 5 years if you have a Pap test in combination with an HPV test.  Bone density scan. This is done to screen for osteoporosis. You may have this scan if you are at high risk for osteoporosis. Discuss your test results, treatment options, and if necessary, the need for more tests with your health care provider. Vaccines  Your health care provider may recommend certain vaccines, such as:  Influenza vaccine. This is recommended every year.  Tetanus, diphtheria, and acellular pertussis (Tdap, Td) vaccine. You may need a Td booster every 10 years.  Zoster vaccine. You may need this after age 71.  Pneumococcal 13-valent conjugate (PCV13) vaccine. You may need this if you have certain conditions and were not previously vaccinated.  Pneumococcal polysaccharide (PPSV23) vaccine. You may need one or two doses if you smoke cigarettes or if you have certain conditions. Talk to your health care provider about which screenings and vaccines you need and how often you need them. This information is not intended to replace advice given to you by your health care provider. Make sure you discuss any questions you have with your health care provider. Document Released: 03/25/2015 Document Revised: 11/16/2015 Document Reviewed: 12/28/2014 Elsevier Interactive Patient Education  2017 [place] Prevention in the Home Falls can cause injuries. They can happen to people of all ages. There are many things you can do to make your home safe and to help prevent falls. What can I do on the outside of my home?  Regularly fix the edges of walkways and driveways and fix any cracks.  Remove anything that might make you trip as you walk through a door,  such as a raised step or threshold.  Trim any bushes or trees on the path to your home.  Use bright outdoor lighting.  Clear any walking paths of anything that might make someone trip, such as rocks or tools.  Regularly check to see if handrails are loose or broken. Make sure that both sides of any steps have handrails.  Any raised decks and porches should have guardrails on the edges.  Have any leaves, snow, or ice cleared regularly.  Use sand or salt on walking paths during winter.  Clean up any spills in your garage right away. This includes oil or grease spills. What can I do in the bathroom?  Use night lights.  Install grab bars by the toilet and in the tub and shower. Do not use towel bars as grab bars.  Use non-skid mats or decals in the tub or shower.  If you need to sit down in the shower, use a plastic, non-slip stool.  Keep the floor dry. Clean up any water that spills on the floor as soon as it happens.  Remove soap buildup in the tub or shower regularly.  Attach bath mats securely with double-sided non-slip rug tape.  Do not have throw rugs and other things on the floor  that can make you trip. What can I do in the bedroom?  Use night lights.  Make sure that you have a light by your bed that is easy to reach.  Do not use any sheets or blankets that are too big for your bed. They should not hang down onto the floor.  Have a firm chair that has side arms. You can use this for support while you get dressed.  Do not have throw rugs and other things on the floor that can make you trip. What can I do in the kitchen?  Clean up any spills right away.  Avoid walking on wet floors.  Keep items that you use a lot in easy-to-reach places.  If you need to reach something above you, use a strong step stool that has a grab bar.  Keep electrical cords out of the way.  Do not use floor polish or wax that makes floors slippery. If you must use wax, use non-skid  floor wax.  Do not have throw rugs and other things on the floor that can make you trip. What can I do with my stairs?  Do not leave any items on the stairs.  Make sure that there are handrails on both sides of the stairs and use them. Fix handrails that are broken or loose. Make sure that handrails are as long as the stairways.  Check any carpeting to make sure that it is firmly attached to the stairs. Fix any carpet that is loose or worn.  Avoid having throw rugs at the top or bottom of the stairs. If you do have throw rugs, attach them to the floor with carpet tape.  Make sure that you have a light switch at the top of the stairs and the bottom of the stairs. If you do not have them, ask someone to add them for you. What else can I do to help prevent falls?  Wear shoes that:  Do not have high heels.  Have rubber bottoms.  Are comfortable and fit you well.  Are closed at the toe. Do not wear sandals.  If you use a stepladder:  Make sure that it is fully opened. Do not climb a closed stepladder.  Make sure that both sides of the stepladder are locked into place.  Ask someone to hold it for you, if possible.  Clearly mark and make sure that you can see:  Any grab bars or handrails.  First and last steps.  Where the edge of each step is.  Use tools that help you move around (mobility aids) if they are needed. These include:  Canes.  Walkers.  Scooters.  Crutches.  Turn on the lights when you go into a dark area. Replace any light bulbs as soon as they burn out.  Set up your furniture so you have a clear path. Avoid moving your furniture around.  If any of your floors are uneven, fix them.  If there are any pets around you, be aware of where they are.  Review your medicines with your doctor. Some medicines can make you feel dizzy. This can increase your chance of falling. Ask your doctor what other things that you can do to help prevent falls. This  information is not intended to replace advice given to you by your health care provider. Make sure you discuss any questions you have with your health care provider. Document Released: 12/23/2008 Document Revised: 08/04/2015 Document Reviewed: 04/02/2014 Elsevier Interactive Patient Education  2017 Elsevier  Inc.

## 2019-05-28 ENCOUNTER — Telehealth: Payer: Self-pay

## 2019-05-28 NOTE — Telephone Encounter (Signed)
I spoke with the patient about her BAKO results and advised that she will need an appointment to discuss treatment options. She will call back to schedule an appointment when her daughter can be present, because she will be bringing the patient to her appointment. Advised that there is no hussy, to schedule at her convenience.

## 2019-05-29 ENCOUNTER — Telehealth: Payer: Self-pay | Admitting: Podiatry

## 2019-05-29 NOTE — Telephone Encounter (Signed)
Pt called to say that she will be  back in the office starting in the fall. Her daughter is her transportation and she will be back in town in the fall to get her  To her appts

## 2019-06-30 ENCOUNTER — Encounter: Payer: Self-pay | Admitting: Family Medicine

## 2019-06-30 DIAGNOSIS — H18413 Arcus senilis, bilateral: Secondary | ICD-10-CM | POA: Diagnosis not present

## 2019-06-30 DIAGNOSIS — H16223 Keratoconjunctivitis sicca, not specified as Sjogren's, bilateral: Secondary | ICD-10-CM | POA: Diagnosis not present

## 2019-06-30 DIAGNOSIS — Z961 Presence of intraocular lens: Secondary | ICD-10-CM | POA: Diagnosis not present

## 2019-06-30 DIAGNOSIS — H401131 Primary open-angle glaucoma, bilateral, mild stage: Secondary | ICD-10-CM | POA: Diagnosis not present

## 2019-07-01 ENCOUNTER — Other Ambulatory Visit: Payer: Self-pay

## 2019-07-01 ENCOUNTER — Encounter: Payer: Self-pay | Admitting: Family Medicine

## 2019-07-01 ENCOUNTER — Ambulatory Visit (INDEPENDENT_AMBULATORY_CARE_PROVIDER_SITE_OTHER): Payer: Medicare Other | Admitting: Family Medicine

## 2019-07-01 VITALS — BP 150/80 | HR 65 | Temp 97.8°F | Ht 60.0 in | Wt 131.2 lb

## 2019-07-01 DIAGNOSIS — R0989 Other specified symptoms and signs involving the circulatory and respiratory systems: Secondary | ICD-10-CM

## 2019-07-01 DIAGNOSIS — R030 Elevated blood-pressure reading, without diagnosis of hypertension: Secondary | ICD-10-CM | POA: Diagnosis not present

## 2019-07-01 DIAGNOSIS — M818 Other osteoporosis without current pathological fracture: Secondary | ICD-10-CM | POA: Diagnosis not present

## 2019-07-01 DIAGNOSIS — Z23 Encounter for immunization: Secondary | ICD-10-CM | POA: Diagnosis not present

## 2019-07-01 DIAGNOSIS — E785 Hyperlipidemia, unspecified: Secondary | ICD-10-CM | POA: Diagnosis not present

## 2019-07-01 LAB — LIPID PANEL
Cholesterol: 220 mg/dL — ABNORMAL HIGH (ref 0–200)
HDL: 75.8 mg/dL (ref >39.00)
LDL Cholesterol: 125 mg/dL — ABNORMAL HIGH (ref 0–99)
NonHDL: 143.81
Total CHOL/HDL Ratio: 3
Triglycerides: 95 mg/dL (ref 0.0–149.0)
VLDL: 19 mg/dL (ref 0.0–40.0)

## 2019-07-01 LAB — COMPREHENSIVE METABOLIC PANEL
ALT: 16 U/L (ref 0–35)
AST: 22 U/L (ref 0–37)
Albumin: 4.2 g/dL (ref 3.5–5.2)
Alkaline Phosphatase: 72 U/L (ref 39–117)
BUN: 29 mg/dL — ABNORMAL HIGH (ref 6–23)
CO2: 30 mEq/L (ref 19–32)
Calcium: 10.5 mg/dL (ref 8.4–10.5)
Chloride: 103 mEq/L (ref 96–112)
Creatinine, Ser: 0.91 mg/dL (ref 0.40–1.20)
GFR: 57.56 mL/min — ABNORMAL LOW (ref >60.00)
Glucose, Bld: 94 mg/dL (ref 70–99)
Potassium: 4.7 mEq/L (ref 3.5–5.1)
Sodium: 140 mEq/L (ref 135–145)
Total Bilirubin: 0.8 mg/dL (ref 0.2–1.2)
Total Protein: 6.8 g/dL (ref 6.0–8.3)

## 2019-07-01 NOTE — Progress Notes (Signed)
Phone [phone] In person visit   Subjective:   Tina Schroeder is a 86 y.o. year old very pleasant female patient who presents for/with See problem oriented charting Chief Complaint  Patient presents with  . Follow-up    This visit occurred during the SARS-CoV-2 public health emergency.  Safety protocols were in place, including screening questions prior to the visit, additional usage of staff PPE, and extensive cleaning of exam room while observing appropriate contact time as indicated for disinfecting solutions.   Past Medical History-  Patient Active Problem List   Diagnosis Date Noted  . Osteoporosis 11/18/2006    Priority: High  . Dyslipidemia 07/08/2013    Priority: Medium  . Right carotid bruit 10/09/2007    Priority: Low  . White coat syndrome without hypertension 05/24/2018  . Glaucoma 05/24/2018  . Basal cell carcinoma of face 02/24/2013    Medications- reviewed and updated Current Outpatient Medications  Medication Sig Dispense Refill  . aspirin EC 81 MG tablet Take 81 mg by mouth once a week.    . dorzolamide-timolol (COSOPT) 22.3-6.8 MG/ML ophthalmic solution INT 1 GTT INTO OU BID FOR 30 DAYS  6  . RESTASIS 0.05 % ophthalmic emulsion Place 1 drop into both eyes 2 (two) times daily.      No current facility-administered medications for this visit.     Objective:  BP (!) 150/80   Pulse 65   Temp 97.8 F (36.6 C)   Ht 5' (1.524 m)   Wt 131 lb 3.2 oz (59.5 kg)   SpO2 100%   BMI 25.62 kg/m  Gen: NAD, resting comfortably, appears much younger than stated age CV: RRR no murmurs rubs or gallops Lungs: CTAB no crackles, wheeze, rhonchi Ext: no edema Skin: warm, dry    Assessment and Plan   #hyperlipidemia/right carotid bruit S: Poorly controlled on no medication.  We have opted not to start statin for primary prevention at her age.  She does take aspirin once a week-does have mild narrowing of carotid artery stenosis is under 40%.  States had been on  statins in past and she did not do well.   Less than 40% stenosis noted March 2018.  Does have tortuous carotid artery Lab Results  Component Value Date   CHOL 235 (H) 06/25/2017   HDL 81.00 06/25/2017   LDLCALC 134 (H) 06/25/2017   LDLDIRECT 140.5 09/07/2010   TRIG 100.0 06/25/2017   CHOLHDL 3 06/25/2017   A/P: Untreated hyperlipidemia.  Patient does have some plaque on carotid arteries but over age 89 I do not feel strongly about starting statin.  We will continue sparing aspirin once a week-no history of GI bleeding issues  #Osteoporosis S: Patient took Fosamax in the past for at least 5 years-has been concerned about osteonecrosis of the jaw.  She takes calcium and vitamin D.  A/P:   We discussed possibly repeating bone density but since she would not want to take medication regardless opted again to not pursue this  #Whitecoat syndrome without hypertension S: medication: none Home readings #s: 100-115/60s BP Readings from Last 3 Encounters:  07/01/19 (!) 150/80  05/22/18 140/80  06/25/17 (!) 180/80  A/P: Excellent control of blood pressure at home.  Continues to have whitecoat hypertension in office-continue without medicine as long as home blood pressures remain well controlled  #Health maintenance-patient new tetanus shot may be cheaper at the pharmacy but since she does not drive she prefer to go ahead and get this today-I told her this  could be up to $120 in cost.  She also ask about Shingrix and I recommended she more strongly consider this at the pharmacy since I believe it is over $300  Recommended follow up: 1 year follow-up  Lab/Order associations:  Not fasting   ICD-10-CM   1. Dyslipidemia  E78.5 CBC with Differential/Platelet    Comprehensive metabolic panel    Lipid panel  2. Other osteoporosis, unspecified pathological fracture presence  M81.8   3. Right carotid bruit  R09.89   4. White coat syndrome without hypertension  R03.0   5. Need for Tdap vaccination   Z23 Tdap vaccine greater than or equal to 7yo IM   Time Spent: 20 minutes of total time (11:22 AM- 11:39 AM. 12:58 PM-1:01 PM) was spent on the date of the encounter performing the following actions: chart review prior to seeing the patient, obtaining history, performing a medically necessary exam, counseling on the treatment plan, placing orders, and documenting in our EHR.   Return precautions advised.  Garret Reddish, MD

## 2019-07-01 NOTE — Patient Instructions (Addendum)
Health Maintenance Due  Topic Date Due  . TETANUS/TDAP - today- you wanted to do this even though would be cheaper at pharmacy- I think its around 110-120 01/11/2019   Please check with your pharmacy to see if they have the shingrix vaccine. If they do- please get this immunization and update [place] by phone call or mychart with dates you receive the vaccine. I definitely would not do shingrix shot here- I believe that's $300.    Please stop by lab before you go If you do not have mychart- we will call you about results within 5 business days of [place] receiving them.  If you have mychart- we will send your results within 3 business days of [place] receiving them.  If abnormal or we want to clarify a result, we will call or mychart you to make sure you receive the message.  If you have questions or concerns or don't hear within 5 business days, please send [place] a message or call us.

## 2019-07-02 ENCOUNTER — Ambulatory Visit: Payer: Medicare Other | Admitting: Podiatry

## 2019-07-02 LAB — CBC WITH DIFFERENTIAL/PLATELET
Basophils Absolute: 0.1 10*3/uL (ref 0.0–0.1)
Basophils Relative: 1.2 % (ref 0.0–3.0)
Eosinophils Absolute: 0.1 10*3/uL (ref 0.0–0.7)
Eosinophils Relative: 1.3 % (ref 0.0–5.0)
HCT: 41.1 % (ref 36.0–46.0)
Hemoglobin: 13.7 g/dL (ref 12.0–15.0)
Lymphocytes Relative: 29.2 % (ref 12.0–46.0)
Lymphs Abs: 1.8 10*3/uL (ref 0.7–4.0)
MCHC: 33.3 g/dL (ref 30.0–36.0)
MCV: 93.7 fl (ref 78.0–100.0)
Monocytes Absolute: 0.5 10*3/uL (ref 0.1–1.0)
Monocytes Relative: 8.7 % (ref 3.0–12.0)
Neutro Abs: 3.6 10*3/uL (ref 1.4–7.7)
Neutrophils Relative %: 59.6 % (ref 43.0–77.0)
Platelets: 253 10*3/uL (ref 150.0–400.0)
RBC: 4.39 Mil/uL (ref 3.87–5.11)
RDW: 14 % (ref 11.5–15.5)
WBC: 6.1 10*3/uL (ref 4.0–10.5)

## 2019-10-07 ENCOUNTER — Encounter: Payer: Self-pay | Admitting: Family Medicine

## 2019-10-08 NOTE — Progress Notes (Signed)
Letter has been printed. Ready for signature. Will place up front at check in for pickup.

## 2019-11-12 ENCOUNTER — Other Ambulatory Visit: Payer: Self-pay | Admitting: Family Medicine

## 2019-11-12 DIAGNOSIS — Z1231 Encounter for screening mammogram for malignant neoplasm of breast: Secondary | ICD-10-CM

## 2019-12-31 ENCOUNTER — Other Ambulatory Visit: Payer: Self-pay

## 2019-12-31 ENCOUNTER — Ambulatory Visit
Admission: RE | Admit: 2019-12-31 | Discharge: 2019-12-31 | Disposition: A | Discharge status: Home / Self Care | Payer: Medicare Other | Source: Ambulatory Visit | Attending: Family Medicine | Admitting: Family Medicine

## 2019-12-31 DIAGNOSIS — Z1231 Encounter for screening mammogram for malignant neoplasm of breast: Secondary | ICD-10-CM | POA: Diagnosis not present

## 2019-12-31 DIAGNOSIS — Z23 Encounter for immunization: Secondary | ICD-10-CM | POA: Diagnosis not present

## 2020-01-19 DIAGNOSIS — H401131 Primary open-angle glaucoma, bilateral, mild stage: Secondary | ICD-10-CM | POA: Diagnosis not present

## 2020-01-19 DIAGNOSIS — H26491 Other secondary cataract, right eye: Secondary | ICD-10-CM | POA: Diagnosis not present

## 2020-01-19 DIAGNOSIS — H16223 Keratoconjunctivitis sicca, not specified as Sjogren's, bilateral: Secondary | ICD-10-CM | POA: Diagnosis not present

## 2020-01-19 DIAGNOSIS — Z961 Presence of intraocular lens: Secondary | ICD-10-CM | POA: Diagnosis not present

## 2020-01-21 ENCOUNTER — Encounter: Payer: Self-pay | Admitting: Podiatry

## 2020-01-21 ENCOUNTER — Other Ambulatory Visit: Payer: Self-pay

## 2020-01-21 ENCOUNTER — Ambulatory Visit (INDEPENDENT_AMBULATORY_CARE_PROVIDER_SITE_OTHER): Payer: Medicare Other | Admitting: Podiatry

## 2020-01-21 DIAGNOSIS — L603 Nail dystrophy: Secondary | ICD-10-CM

## 2020-01-21 MED ORDER — FLUCONAZOLE 150 MG PO TABS: 300.0000 mg | ORAL_TABLET | ORAL | 0 refills | Status: DC

## 2020-01-21 NOTE — Patient Instructions (Signed)
Fluconazole tablets What is this medicine? FLUCONAZOLE (floo KON na zole) is an antifungal medicine. It is used to treat certain kinds of fungal or yeast infections. This medicine may be used for other purposes; ask your health care provider or pharmacist if you have questions. COMMON BRAND NAME(S): Diflucan What should I tell my health care provider before I take this medicine? They need to know if you have any of these conditions:  history of irregular heart beat  kidney disease  an unusual or allergic reaction to fluconazole, other azole antifungals, medicines, foods, dyes, or preservatives  pregnant or trying to get pregnant  breast-feeding How should I use this medicine? Take this medicine by mouth. Follow the directions on the prescription label. Do not take your medicine more often than directed. Talk to your pediatrician regarding the use of this medicine in children. Special care may be needed. This medicine has been used in children as young as 3 months of age. Overdosage: If you think you have taken too much of this medicine contact a poison control center or emergency room at once. NOTE: This medicine is only for you. Do not share this medicine with others. What if I miss a dose? If you miss a dose, take it as soon as you can. If it is almost time for your next dose, take only that dose. Do not take double or extra doses. What may interact with this medicine? Do not take this medicine with any of the following medications:  astemizole  certain medicines for irregular heart beat like dronedarone, quinidine  cisapride  erythromycin  lomitapide  other medicines that prolong the QT interval (cause an abnormal heart rhythm)  pimozide  terfenadine  thioridazine This medicine may also interact with the following medications:  antiviral medicines for HIV or AIDS  birth control pills  certain antibiotics like rifabutin, rifampin  certain medicines for blood  pressure like amlodipine, isradipine, felodipine, hydrochlorothiazide, losartan, nifedipine  certain medicines for cancer like cyclophosphamide, ibrutinib, vinblastine, vincristine  certain medicines for cholesterol like atorvastatin, lovastatin, fluvastatin, simvastatin  certain medicines for depression, anxiety, or psychotic disturbances like amitriptyline, midazolam, nortriptyline, triazolam  certain medicines for diabetes like glipizide, glyburide, tolbutamide  certain medicines for pain like alfentanil, fentanyl, methadone  certain medicines for seizures like carbamazepine, phenytoin  certain medicines that treat or prevent blood clots like warfarin  dofetilide  halofantrine  medicines that lower your chance of fighting infection like cyclosporine, prednisone, tacrolimus  NSAIDS, medicines for pain and inflammation, like celecoxib, diclofenac, flurbiprofen, ibuprofen, meloxicam, naproxen  other medicines for fungal infections  sirolimus  theophylline  tofacitinib  tolvaptan  ziprasidone This list may not describe all possible interactions. Give your health care provider a list of all the medicines, herbs, non-prescription drugs, or dietary supplements you use. Also tell them if you smoke, drink alcohol, or use illegal drugs. Some items may interact with your medicine. What should I watch for while using this medicine? Visit your doctor or health care professional for regular checkups. If you are taking this medicine for a long time you may need blood work. Tell your doctor if your symptoms do not improve. Some fungal infections need many weeks or months of treatment to cure. Alcohol can increase possible damage to your liver. Avoid alcoholic drinks. If you have a vaginal infection, do not have sex until you have finished your treatment. You can wear a sanitary napkin. Do not use tampons. Wear freshly washed cotton, not synthetic, panties. What side effects  may I notice  from receiving this medicine? Side effects that you should report to your doctor or health care professional as soon as possible:  allergic reactions like skin rash or itching, hives, swelling of the lips, mouth, tongue, or throat  dark urine  feeling dizzy or faint  irregular heartbeat or chest pain  redness, blistering, peeling or loosening of the skin, including inside the mouth  trouble breathing  unusual bruising or bleeding  vomiting  yellowing of the eyes or skin Side effects that usually do not require medical attention (report to your doctor or health care professional if they continue or are bothersome):  changes in how food tastes  diarrhea  headache  stomach upset or nausea This list may not describe all possible side effects. Call your doctor for medical advice about side effects. You may report side effects to FDA at 1-800-FDA-1088. Where should I keep my medicine? Keep out of the reach of children. Store at room temperature below 30 degrees C (86 degrees F). Throw away any medicine after the expiration date. NOTE: This sheet is a summary. It may not cover all possible information. If you have questions about this medicine, talk to your doctor, pharmacist, or health care provider.  2020 Elsevier/Gold Standard (2018-11-20 11:41:56)

## 2020-01-23 ENCOUNTER — Ambulatory Visit: Payer: Medicare Other | Attending: Internal Medicine

## 2020-01-23 DIAGNOSIS — Z23 Encounter for immunization: Secondary | ICD-10-CM

## 2020-01-23 NOTE — Progress Notes (Signed)
She presents today for follow-up of her nail pathology.  Objective: Pathology states that it is a yeast infection resulting in onychomycosis  Assessment: Onychomycosis yeast infection.  Plan: Started her on Diflucan she will be taking 2 tablets by mouth once a week for the next 11 months.  I will follow-up with her in about 3 months for blood work.  Currently we are able to use a blood work for her baseline that is already in epic and her values are perfectly normal.

## 2020-01-23 NOTE — Progress Notes (Signed)
   Covid-19 Vaccination Clinic  Name:  Tina Schroeder    MRN: [id number] DOB: March 08, 1924  01/23/2020  [name] was observed post Covid-19 immunization for 15 minutes without incident. She was provided with Vaccine Information Sheet and instruction to access the V-Safe system.   [name] was instructed to call 911 with any severe reactions post vaccine: Marland Kitchen Difficulty breathing  . Swelling of face and throat  . A fast heartbeat  . A bad rash all over body  . Dizziness and weakness   Immunizations Administered    Name Date Dose VIS Date Route   Pfizer COVID-19 Vaccine 01/23/2020  4:05 PM 0.3 mL 12/30/2019 Intramuscular   Manufacturer: [place]   Lot: [id number]   [place]: [id number]

## 2020-01-25 ENCOUNTER — Telehealth: Payer: Self-pay | Admitting: Podiatry

## 2020-01-25 NOTE — Telephone Encounter (Signed)
Pt called having questions about laser treatment. I advised her on how many treatments we usually do, and how often they're done. She will call back tomorrow to schedule those appointments.

## 2020-01-28 ENCOUNTER — Other Ambulatory Visit: Payer: Self-pay

## 2020-01-28 ENCOUNTER — Ambulatory Visit (INDEPENDENT_AMBULATORY_CARE_PROVIDER_SITE_OTHER): Payer: Medicare Other | Admitting: *Deleted

## 2020-01-28 DIAGNOSIS — L603 Nail dystrophy: Secondary | ICD-10-CM

## 2020-01-28 DIAGNOSIS — B351 Tinea unguium: Secondary | ICD-10-CM

## 2020-01-28 NOTE — Patient Instructions (Signed)

## 2020-01-28 NOTE — Progress Notes (Signed)
Patient presents today for the 1st laser treatment. Diagnosed with mycotic nail infection by Dr. Milinda Pointer.   Toenail most affected are all ten with the hallux right being the worst.  All other systems are negative.  Nails were filed thin. Laser therapy was administered to 1-5 toenails bilateral and patient tolerated the treatment well. All safety precautions were in place.   She was initially prescribed diflucan, but has decided not to take and swithc to laser therapy.  Follow up in 4 weeks for laser # 2.  Picture of nails taken today to document visual progress

## 2020-02-10 ENCOUNTER — Telehealth: Payer: Self-pay

## 2020-02-10 DIAGNOSIS — H903 Sensorineural hearing loss, bilateral: Secondary | ICD-10-CM | POA: Diagnosis not present

## 2020-02-10 NOTE — Telephone Encounter (Signed)
Tina Schroeder from [facility] is calling regarding a referral for patient can we send one over to them the patient is there today getting a hearing aide and the facility called [place] and asked if we can send a referral over due to her primary being medicare.

## 2020-02-12 ENCOUNTER — Other Ambulatory Visit: Payer: Self-pay

## 2020-02-12 DIAGNOSIS — H9190 Unspecified hearing loss, unspecified ear: Secondary | ICD-10-CM

## 2020-02-12 NOTE — Telephone Encounter (Signed)
Would you prefer to do a in office or virtual visit with the patient? I looked in chart and was unable to find anything that stated she needed to see audiology. Please advise.

## 2020-02-12 NOTE — Telephone Encounter (Signed)
Since she is already at the hearing office and needs referral- can just enter as urgent and see if Lattie Haw can get this done today under hearing loss

## 2020-02-12 NOTE — Telephone Encounter (Signed)
Referral has been placed. 

## 2020-02-16 ENCOUNTER — Ambulatory Visit: Payer: Medicare Other | Admitting: Podiatry

## 2020-03-03 ENCOUNTER — Other Ambulatory Visit: Payer: Self-pay

## 2020-03-03 ENCOUNTER — Ambulatory Visit (INDEPENDENT_AMBULATORY_CARE_PROVIDER_SITE_OTHER): Payer: Medicare Other | Admitting: *Deleted

## 2020-03-03 DIAGNOSIS — L603 Nail dystrophy: Secondary | ICD-10-CM

## 2020-03-03 DIAGNOSIS — B351 Tinea unguium: Secondary | ICD-10-CM

## 2020-03-03 NOTE — Progress Notes (Signed)
Patient presents today for the 2nd laser treatment. Diagnosed with mycotic nail infection by Dr. Milinda Pointer.   Toenail most affected are all ten with the hallux right being the worst.  All other systems are negative.  Nails were filed thin. Laser therapy was administered to 1-5 toenails bilateral and patient tolerated the treatment well. All safety precautions were in place.   She was initially prescribed diflucan, but has decided not to take and switch to laser therapy.  Follow up in 4 weeks for laser # 3.

## 2020-04-15 ENCOUNTER — Other Ambulatory Visit: Payer: Self-pay

## 2020-04-15 ENCOUNTER — Ambulatory Visit (INDEPENDENT_AMBULATORY_CARE_PROVIDER_SITE_OTHER): Payer: Medicare Other | Admitting: *Deleted

## 2020-04-15 DIAGNOSIS — L603 Nail dystrophy: Secondary | ICD-10-CM

## 2020-04-15 DIAGNOSIS — B351 Tinea unguium: Secondary | ICD-10-CM

## 2020-04-15 NOTE — Progress Notes (Signed)
Patient presents today for the 3rd laser treatment. Diagnosed with mycotic nail infection by Dr. Milinda Pointer.   Toenail most affected are all ten with the hallux right being the worst. Not much improvement.  All other systems are negative.  Nails were filed thin. Laser therapy was administered to 1-5 toenails bilateral and patient tolerated the treatment well. All safety precautions were in place.   She was initially prescribed diflucan, but has decided not to take and switch to laser therapy.  Follow up in 6 weeks for laser # 4.

## 2020-05-13 ENCOUNTER — Other Ambulatory Visit: Payer: Medicare Other

## 2020-05-20 ENCOUNTER — Other Ambulatory Visit: Payer: Medicare Other

## 2020-05-24 ENCOUNTER — Ambulatory Visit: Payer: Medicare Other | Admitting: Podiatry

## 2020-05-27 ENCOUNTER — Ambulatory Visit (INDEPENDENT_AMBULATORY_CARE_PROVIDER_SITE_OTHER): Payer: Medicare Other

## 2020-05-27 ENCOUNTER — Other Ambulatory Visit: Payer: Self-pay

## 2020-05-27 DIAGNOSIS — B351 Tinea unguium: Secondary | ICD-10-CM

## 2020-05-27 DIAGNOSIS — L603 Nail dystrophy: Secondary | ICD-10-CM

## 2020-05-27 NOTE — Progress Notes (Signed)
Patient presents today for the 4th laser treatment. Diagnosed with mycotic nail infection by Dr. Milinda Pointer.   Toenail most affected are all ten with the hallux right being the worst. Not much improvement.  All other systems are negative.  Nails were filed thin. Laser therapy was administered to 1-5 toenails bilateral and patient tolerated the treatment well. All safety precautions were in place.   She was initially prescribed diflucan, but has decided not to take and switch to laser therapy.  Follow up in 6 weeks for laser # 5.

## 2020-07-07 NOTE — Progress Notes (Signed)
Phone [phone] In person visit   Subjective:   Tina Schroeder is a 87 y.o. year old very pleasant female patient who presents for/with See problem oriented charting Chief Complaint  Patient presents with  . Dyslipidemia  . Osteoporosis   This visit occurred during the SARS-CoV-2 public health emergency.  Safety protocols were in place, including screening questions prior to the visit, additional usage of staff PPE, and extensive cleaning of exam room while observing appropriate contact time as indicated for disinfecting solutions.   Past Medical History-  Patient Active Problem List   Diagnosis Date Noted  . Osteoporosis 11/18/2006    Priority: High  . Dyslipidemia 07/08/2013    Priority: Medium  . Right carotid bruit 10/09/2007    Priority: Low  . White coat syndrome without hypertension 05/24/2018  . Glaucoma 05/24/2018  . Basal cell carcinoma of face 02/24/2013    Medications- reviewed and updated Current Outpatient Medications  Medication Sig Dispense Refill  . aspirin EC 81 MG tablet Take 81 mg by mouth once a week.    Marland Kitchen CALCIUM PO Take by mouth.    . dorzolamide-timolol (COSOPT) 22.3-6.8 MG/ML ophthalmic solution INT 1 GTT INTO OU BID FOR 30 DAYS  6  . Multiple Vitamin (MULTIVITAMIN) tablet Take 1 tablet by mouth daily.    . RESTASIS 0.05 % ophthalmic emulsion Place 1 drop into both eyes 2 (two) times daily.      No current facility-administered medications for this visit.     Objective:  BP 118/62 Comment: home blood pressure reading  Pulse 71   Temp 97.9 F (36.6 C) (Temporal)   Ht 5' (1.524 m)   Wt 128 lb 9.6 oz (58.3 kg)   SpO2 94%   BMI 25.12 kg/m  Gen: NAD, resting comfortably Right carotid bruit noted, normal on left CV: RRR no murmurs rubs or gallops Lungs: CTAB no crackles, wheeze, rhonchi Abdomen: soft/nontender/nondistended/normal bowel sounds. No rebound or guarding.  Ext: no edema Skin: warm, dry Neuro: hearing aids in place     Assessment and Plan   #hyperlipidemia/right carotid bruit/mild stenosis S: Medication: No medications other than aspirin 81 mg once a week Patient with tortuous carotid artery-less than 40% stenosis noted March 2018.  Patient has preferred to remain off of medicine at her age despite having some plaque. - leg weakness on pravastatin in 2015 Lab Results  Component Value Date   CHOL 220 (H) 07/01/2019   HDL 75.80 07/01/2019   LDLCALC 125 (H) 07/01/2019   LDLDIRECT 140.5 09/07/2010   TRIG 95.0 07/01/2019   CHOLHDL 3 07/01/2019   A/P: Patient with untreated hyperlipidemia with side effects on medicine-she is wanting to work on diet and exercise.  She does have some plaque on carotid arteries but over age 34 and patient has preferred to remain off of statin (plus had prior side effects)-I think that is reasonable.  We will continue sparing aspirin once a week-no history of GI bleeding issues - also offered repeat carotid duplex but she declines  # Osteoporosis S: Last DEXA: 09/16/2013 with osteoporosis -uses cane to prevent falls  Medication (bisphosphonate or prolia): Has been on Fosamax in the past with concern for osteonecrosis of the jaw  Calcium: 1200mg  (through diet ok) recommended  Vitamin D: 1000 units a day recommended -Also recommended weightbearing exercise- doing some walking and some home weighted exercise   A/P: Since patient does not want to take medication such as Fosamax we are not sure of the utility of updating  bone density.  Offered to repeat today and she declines- strongly opposed to any treatments like fosamax- worried about osteonecrosis of jaw- has been emphasized by her dentist  #White coat syndrome without hypertension S: medication: none Home readings #s:  118/62 this morning with pulse 60 BP Readings from Last 3 Encounters:  07/12/20 118/62  07/01/19 (!) 150/80  05/22/18 140/80  A/P: well controlled at home- continue to monitor  Recommended follow up:  Return in about 1 year (around 07/12/2021) for follow up- or sooner if needed. Future Appointments  Date Time Provider [place]  07/15/2020  1:00 PM [facility] NURSE [facility]    Lab/Order associations:   ICD-10-CM   1. Dyslipidemia  E78.5 CBC with Differential/Platelet    Comprehensive metabolic panel    Lipid panel  2. Other osteoporosis, unspecified pathological fracture presence  M81.8 VITAMIN D 25 Hydroxy (Vit-D Deficiency, Fractures)  3. Right carotid bruit  R09.89    Time Spent: 26 minutes of total time (11:41 AM- 12:07 PM) was spent on the date of the encounter performing the following actions: chart review prior to seeing the patient, obtaining history, performing a medically necessary exam, counseling on the treatment plan, placing orders, and documenting in our EHR.   Return precautions advised.  Garret Reddish, MD

## 2020-07-07 NOTE — Patient Instructions (Addendum)
You are eligible to schedule your annual wellness visit with our nurse specialist Otila Kluver.  Please consider scheduling this before you leave today  Please stop by lab before you go If you have mychart- we will send your results within 3 business days of [place] receiving them.  If you do not have mychart- we will call you about results within 5 business days of [place] receiving them.  *please also note that you will see labs on mychart as soon as they post. I will later go in and write notes on them- will say "notes from Dr. Yong Channel"  Recommended follow up: Return in about 1 year (around 07/12/2021) for follow up- or sooner if needed.

## 2020-07-12 ENCOUNTER — Ambulatory Visit (INDEPENDENT_AMBULATORY_CARE_PROVIDER_SITE_OTHER): Payer: Medicare Other | Admitting: Family Medicine

## 2020-07-12 ENCOUNTER — Encounter: Payer: Self-pay | Admitting: Family Medicine

## 2020-07-12 ENCOUNTER — Other Ambulatory Visit: Payer: Self-pay

## 2020-07-12 VITALS — BP 118/62 | HR 71 | Temp 97.9°F | Ht 60.0 in | Wt 128.6 lb

## 2020-07-12 DIAGNOSIS — R0989 Other specified symptoms and signs involving the circulatory and respiratory systems: Secondary | ICD-10-CM

## 2020-07-12 DIAGNOSIS — M818 Other osteoporosis without current pathological fracture: Secondary | ICD-10-CM | POA: Diagnosis not present

## 2020-07-12 DIAGNOSIS — E785 Hyperlipidemia, unspecified: Secondary | ICD-10-CM | POA: Diagnosis not present

## 2020-07-12 LAB — LIPID PANEL
Cholesterol: 250 mg/dL — ABNORMAL HIGH (ref 0–200)
HDL: 70.3 mg/dL (ref >39.00)
LDL Cholesterol: 153 mg/dL — ABNORMAL HIGH (ref 0–99)
NonHDL: 179.82
Total CHOL/HDL Ratio: 4
Triglycerides: 134 mg/dL (ref 0.0–149.0)
VLDL: 26.8 mg/dL (ref 0.0–40.0)

## 2020-07-12 LAB — CBC WITH DIFFERENTIAL/PLATELET
Basophils Absolute: 0.1 10*3/uL (ref 0.0–0.1)
Basophils Relative: 1.3 % (ref 0.0–3.0)
Eosinophils Absolute: 0.1 10*3/uL (ref 0.0–0.7)
Eosinophils Relative: 1.7 % (ref 0.0–5.0)
HCT: 42.5 % (ref 36.0–46.0)
Hemoglobin: 14.1 g/dL (ref 12.0–15.0)
Lymphocytes Relative: 31.3 % (ref 12.0–46.0)
Lymphs Abs: 1.5 10*3/uL (ref 0.7–4.0)
MCHC: 33.2 g/dL (ref 30.0–36.0)
MCV: 93.6 fl (ref 78.0–100.0)
Monocytes Absolute: 0.4 10*3/uL (ref 0.1–1.0)
Monocytes Relative: 7.4 % (ref 3.0–12.0)
Neutro Abs: 2.8 10*3/uL (ref 1.4–7.7)
Neutrophils Relative %: 58.3 % (ref 43.0–77.0)
Platelets: 274 10*3/uL (ref 150.0–400.0)
RBC: 4.54 Mil/uL (ref 3.87–5.11)
RDW: 13.5 % (ref 11.5–15.5)
WBC: 4.9 10*3/uL (ref 4.0–10.5)

## 2020-07-12 LAB — VITAMIN D 25 HYDROXY (VIT D DEFICIENCY, FRACTURES): VITD: 39 ng/mL (ref 30.00–100.00)

## 2020-07-12 LAB — COMPREHENSIVE METABOLIC PANEL
ALT: 15 U/L (ref 0–35)
AST: 22 U/L (ref 0–37)
Albumin: 4.2 g/dL (ref 3.5–5.2)
Alkaline Phosphatase: 74 U/L (ref 39–117)
BUN: 21 mg/dL (ref 6–23)
CO2: 28 mEq/L (ref 19–32)
Calcium: 9.6 mg/dL (ref 8.4–10.5)
Chloride: 106 mEq/L (ref 96–112)
Creatinine, Ser: 0.82 mg/dL (ref 0.40–1.20)
GFR: 60.95 mL/min (ref >60.00)
Glucose, Bld: 101 mg/dL — ABNORMAL HIGH (ref 70–99)
Potassium: 4.1 mEq/L (ref 3.5–5.1)
Sodium: 141 mEq/L (ref 135–145)
Total Bilirubin: 0.6 mg/dL (ref 0.2–1.2)
Total Protein: 6.9 g/dL (ref 6.0–8.3)

## 2020-07-12 NOTE — Addendum Note (Signed)
Addended by: Doran Clay A on: 07/12/2020 12:13 PM   Modules accepted: Orders

## 2020-07-14 ENCOUNTER — Encounter: Payer: Self-pay | Admitting: Family Medicine

## 2020-07-15 ENCOUNTER — Other Ambulatory Visit: Payer: Self-pay

## 2020-07-15 ENCOUNTER — Ambulatory Visit (INDEPENDENT_AMBULATORY_CARE_PROVIDER_SITE_OTHER): Payer: Medicare Other

## 2020-07-15 ENCOUNTER — Encounter: Payer: Self-pay | Admitting: Family Medicine

## 2020-07-15 DIAGNOSIS — L603 Nail dystrophy: Secondary | ICD-10-CM

## 2020-07-15 NOTE — Progress Notes (Signed)
Patient presents today for the 5th laser treatment. Diagnosed with mycotic nail infection by Dr. Milinda Pointer.   Toenail most affected are all ten with the hallux right being the worst. Not much improvement.  All other systems are negative.  Nails were filed thin. Laser therapy was administered to 1-5 toenails bilateral and patient tolerated the treatment well. All safety precautions were in place.   She was initially prescribed diflucan, but has decided not to take and switch to laser therapy.  Follow up in 8 weeks for laser # 6.

## 2020-07-15 NOTE — Telephone Encounter (Signed)
Paperwork has been filled out and signed. Patient is aware that her papers are ready to be picked up.

## 2020-07-19 DIAGNOSIS — H401131 Primary open-angle glaucoma, bilateral, mild stage: Secondary | ICD-10-CM | POA: Diagnosis not present

## 2020-07-19 DIAGNOSIS — Z961 Presence of intraocular lens: Secondary | ICD-10-CM | POA: Diagnosis not present

## 2020-07-19 DIAGNOSIS — H16223 Keratoconjunctivitis sicca, not specified as Sjogren's, bilateral: Secondary | ICD-10-CM | POA: Diagnosis not present

## 2020-07-19 DIAGNOSIS — H26491 Other secondary cataract, right eye: Secondary | ICD-10-CM | POA: Diagnosis not present

## 2020-08-04 DIAGNOSIS — B351 Tinea unguium: Secondary | ICD-10-CM

## 2021-01-09 DIAGNOSIS — Z23 Encounter for immunization: Secondary | ICD-10-CM | POA: Diagnosis not present

## 2021-01-16 ENCOUNTER — Other Ambulatory Visit: Payer: Self-pay | Admitting: Family Medicine

## 2021-01-16 DIAGNOSIS — Z1231 Encounter for screening mammogram for malignant neoplasm of breast: Secondary | ICD-10-CM

## 2021-01-24 DIAGNOSIS — H26491 Other secondary cataract, right eye: Secondary | ICD-10-CM | POA: Diagnosis not present

## 2021-01-24 DIAGNOSIS — Z961 Presence of intraocular lens: Secondary | ICD-10-CM | POA: Diagnosis not present

## 2021-01-24 DIAGNOSIS — H401131 Primary open-angle glaucoma, bilateral, mild stage: Secondary | ICD-10-CM | POA: Diagnosis not present

## 2021-01-24 DIAGNOSIS — H16223 Keratoconjunctivitis sicca, not specified as Sjogren's, bilateral: Secondary | ICD-10-CM | POA: Diagnosis not present

## 2021-02-09 ENCOUNTER — Ambulatory Visit (INDEPENDENT_AMBULATORY_CARE_PROVIDER_SITE_OTHER): Payer: Medicare Other | Admitting: Podiatry

## 2021-02-09 ENCOUNTER — Encounter: Payer: Self-pay | Admitting: Podiatry

## 2021-02-09 ENCOUNTER — Other Ambulatory Visit: Payer: Self-pay

## 2021-02-09 DIAGNOSIS — L603 Nail dystrophy: Secondary | ICD-10-CM | POA: Diagnosis not present

## 2021-02-09 NOTE — Progress Notes (Signed)
She presents today for follow-up of her nail fungus states that she is completed her laser nail treatments a total of 5.  She states that I think they are looking better but the right big toe is still the same.  Objective: Vital signs temps alert oriented x3 pulses remain palpable PT and DP bilaterally capillary fill time is immediate toenails demonstrate well-healing onychomycosis with exception of the right hallux which does demonstrate a nail dystrophy.  I think that the nail appears to be lighter in color but is still thick and slowly growing.  Assessment: Slowly resolving onychomycosis.  Nail dystrophy hallux right.  Plan: Recommended that when she returns from [place] continue another 2 to 3 months worth of laser treatments and we will also set her up with Dr. Elisha Ponder for routine nail debridement.

## 2021-02-17 ENCOUNTER — Ambulatory Visit
Admission: RE | Admit: 2021-02-17 | Discharge: 2021-02-17 | Disposition: A | Discharge status: Home / Self Care | Payer: Medicare Other | Source: Ambulatory Visit | Attending: Family Medicine | Admitting: Family Medicine

## 2021-02-17 DIAGNOSIS — Z1231 Encounter for screening mammogram for malignant neoplasm of breast: Secondary | ICD-10-CM

## 2021-03-01 DIAGNOSIS — H401131 Primary open-angle glaucoma, bilateral, mild stage: Secondary | ICD-10-CM | POA: Diagnosis not present

## 2021-05-01 DIAGNOSIS — L638 Other alopecia areata: Secondary | ICD-10-CM | POA: Diagnosis not present

## 2021-05-01 DIAGNOSIS — L821 Other seborrheic keratosis: Secondary | ICD-10-CM | POA: Diagnosis not present

## 2021-05-01 DIAGNOSIS — Z85828 Personal history of other malignant neoplasm of skin: Secondary | ICD-10-CM | POA: Diagnosis not present

## 2021-05-01 DIAGNOSIS — L820 Inflamed seborrheic keratosis: Secondary | ICD-10-CM | POA: Diagnosis not present

## 2021-05-12 ENCOUNTER — Other Ambulatory Visit: Payer: Self-pay

## 2021-05-12 ENCOUNTER — Ambulatory Visit (INDEPENDENT_AMBULATORY_CARE_PROVIDER_SITE_OTHER): Payer: Medicare Other

## 2021-05-12 DIAGNOSIS — L603 Nail dystrophy: Secondary | ICD-10-CM

## 2021-05-12 DIAGNOSIS — B351 Tinea unguium: Secondary | ICD-10-CM

## 2021-05-12 NOTE — Progress Notes (Signed)
Patient presents today for the 6th laser treatment. Diagnosed with mycotic nail infection by Dr. Milinda Pointer.  ? ?Toenail most affected are all ten with the hallux right being the worst. Not much improvement. ? ?All other systems are negative. ? ?Nails were filed thin. Laser therapy was administered to 1-5 toenails bilateral and patient tolerated the treatment well. All safety precautions were in place.  ? ?She was initially prescribed diflucan, but has decided not to take and switch to laser therapy. ? ?Per Dr. Milinda Pointer: Recommended that when she returns from [place] continue another 2 to 3 months worth of laser treatments and we will also set her up with Dr. Elisha Ponder for routine nail debridement. ? ?Follow up in 8 weeks for laser # 7. ? ? ?

## 2021-07-14 ENCOUNTER — Ambulatory Visit (INDEPENDENT_AMBULATORY_CARE_PROVIDER_SITE_OTHER): Payer: Self-pay

## 2021-07-14 DIAGNOSIS — B351 Tinea unguium: Secondary | ICD-10-CM

## 2021-07-14 DIAGNOSIS — L603 Nail dystrophy: Secondary | ICD-10-CM

## 2021-07-14 NOTE — Progress Notes (Signed)
Patient presents today for the 7th laser treatment. Diagnosed with mycotic nail infection by Dr. Milinda Pointer.  ? ?Toenail most affected are all ten with the hallux right being the worst. Not much improvement. ? ?All other systems are negative. ? ?Nails were filed thin. Laser therapy was administered to 1-5 toenails bilateral and patient tolerated the treatment well. All safety precautions were in place.  ? ?She was initially prescribed diflucan, but has decided not to take and switch to laser therapy. ? ?Per Dr. Milinda Pointer: Recommended that when she returns from [place] continue another 2 to 3 months worth of laser treatments and we will also set her up with Dr. Elisha Ponder for routine nail debridement. ? ?Follow up in 8 weeks for laser # 8. ? ? ?

## 2021-07-18 DIAGNOSIS — H401131 Primary open-angle glaucoma, bilateral, mild stage: Secondary | ICD-10-CM | POA: Diagnosis not present

## 2021-07-18 DIAGNOSIS — H26491 Other secondary cataract, right eye: Secondary | ICD-10-CM | POA: Diagnosis not present

## 2021-07-18 DIAGNOSIS — Z961 Presence of intraocular lens: Secondary | ICD-10-CM | POA: Diagnosis not present

## 2021-07-18 DIAGNOSIS — H16223 Keratoconjunctivitis sicca, not specified as Sjogren's, bilateral: Secondary | ICD-10-CM | POA: Diagnosis not present

## 2021-11-28 ENCOUNTER — Telehealth: Payer: Self-pay | Admitting: Podiatry

## 2021-11-28 NOTE — Telephone Encounter (Signed)
Pt is requesting to speak with Joycelyn Schmid in regards to her last appointment on 07/14/21 for Laser treatment. She has a question that was discussed when she was here with [place].

## 2021-12-04 ENCOUNTER — Encounter: Payer: Self-pay | Admitting: *Deleted

## 2021-12-04 NOTE — Telephone Encounter (Signed)
Called patient x2 to discuss the matter. No answer.

## 2021-12-16 DIAGNOSIS — Z23 Encounter for immunization: Secondary | ICD-10-CM | POA: Diagnosis not present

## 2022-01-05 ENCOUNTER — Encounter: Payer: Self-pay | Admitting: Family Medicine

## 2022-01-05 ENCOUNTER — Ambulatory Visit (INDEPENDENT_AMBULATORY_CARE_PROVIDER_SITE_OTHER): Payer: Medicare Other | Admitting: Family Medicine

## 2022-01-05 VITALS — BP 122/64 | HR 66 | Temp 98.0°F | Ht 60.0 in | Wt 122.4 lb

## 2022-01-05 DIAGNOSIS — M818 Other osteoporosis without current pathological fracture: Secondary | ICD-10-CM

## 2022-01-05 DIAGNOSIS — R413 Other amnesia: Secondary | ICD-10-CM

## 2022-01-05 DIAGNOSIS — E785 Hyperlipidemia, unspecified: Secondary | ICD-10-CM

## 2022-01-05 LAB — CBC WITH DIFFERENTIAL/PLATELET
Basophils Absolute: 0.1 10*3/uL (ref 0.0–0.1)
Basophils Relative: 1.3 % (ref 0.0–3.0)
Eosinophils Absolute: 0.1 10*3/uL (ref 0.0–0.7)
Eosinophils Relative: 1.4 % (ref 0.0–5.0)
HCT: 39.5 % (ref 36.0–46.0)
Hemoglobin: 13.1 g/dL (ref 12.0–15.0)
Lymphocytes Relative: 30.6 % (ref 12.0–46.0)
Lymphs Abs: 1.6 10*3/uL (ref 0.7–4.0)
MCHC: 33.1 g/dL (ref 30.0–36.0)
MCV: 93.7 fl (ref 78.0–100.0)
Monocytes Absolute: 0.4 10*3/uL (ref 0.1–1.0)
Monocytes Relative: 7.4 % (ref 3.0–12.0)
Neutro Abs: 3.1 10*3/uL (ref 1.4–7.7)
Neutrophils Relative %: 59.3 % (ref 43.0–77.0)
Platelets: 268 10*3/uL (ref 150.0–400.0)
RBC: 4.22 Mil/uL (ref 3.87–5.11)
RDW: 14 % (ref 11.5–15.5)
WBC: 5.2 10*3/uL (ref 4.0–10.5)

## 2022-01-05 LAB — COMPREHENSIVE METABOLIC PANEL
ALT: 17 U/L (ref 0–35)
AST: 25 U/L (ref 0–37)
Albumin: 4.1 g/dL (ref 3.5–5.2)
Alkaline Phosphatase: 70 U/L (ref 39–117)
BUN: 22 mg/dL (ref 6–23)
CO2: 31 mEq/L (ref 19–32)
Calcium: 10.6 mg/dL — ABNORMAL HIGH (ref 8.4–10.5)
Chloride: 103 mEq/L (ref 96–112)
Creatinine, Ser: 0.8 mg/dL (ref 0.40–1.20)
GFR: 62.13 mL/min (ref >60.00)
Glucose, Bld: 90 mg/dL (ref 70–99)
Potassium: 4 mEq/L (ref 3.5–5.1)
Sodium: 140 mEq/L (ref 135–145)
Total Bilirubin: 0.7 mg/dL (ref 0.2–1.2)
Total Protein: 7 g/dL (ref 6.0–8.3)

## 2022-01-05 LAB — LIPID PANEL
Cholesterol: 203 mg/dL — ABNORMAL HIGH (ref 0–200)
HDL: 75.8 mg/dL (ref >39.00)
LDL Cholesterol: 110 mg/dL — ABNORMAL HIGH (ref 0–99)
NonHDL: 126.83
Total CHOL/HDL Ratio: 3
Triglycerides: 83 mg/dL (ref 0.0–149.0)
VLDL: 16.6 mg/dL (ref 0.0–40.0)

## 2022-01-05 LAB — TSH: TSH: 1.57 u[IU]/mL (ref 0.35–5.50)

## 2022-01-05 LAB — VITAMIN D 25 HYDROXY (VIT D DEFICIENCY, FRACTURES): VITD: 39.29 ng/mL (ref 30.00–100.00)

## 2022-01-05 LAB — VITAMIN B12: Vitamin B-12: 888 pg/mL (ref 211–911)

## 2022-01-05 NOTE — Progress Notes (Signed)
Phone [phone] In person visit   Subjective:   Tina Schroeder is a 88 y.o. year old very pleasant female patient who presents for/with See problem oriented charting Chief Complaint  Patient presents with   Annual Exam    Pt states you wanted her to take her bp after getting out of bed this morning and it was 122/64.    Past Medical History-  Patient Active Problem List   Diagnosis Date Noted   Osteoporosis 11/18/2006    Priority: High   White coat syndrome without hypertension 05/24/2018    Priority: Medium    Glaucoma 05/24/2018    Priority: Medium    Dyslipidemia 07/08/2013    Priority: Medium    Basal cell carcinoma of face 02/24/2013    Priority: Low   Right carotid bruit 10/09/2007    Priority: Low    Medications- reviewed and updated Current Outpatient Medications  Medication Sig Dispense Refill   aspirin EC 81 MG tablet Take 81 mg by mouth once a week.     CALCIUM PO Take by mouth.     dorzolamide-timolol (COSOPT) 22.3-6.8 MG/ML ophthalmic solution INT 1 GTT INTO OU BID FOR 30 DAYS  6   Multiple Vitamin (MULTIVITAMIN) tablet Take 1 tablet by mouth daily.     RESTASIS 0.05 % ophthalmic emulsion Place 1 drop into both eyes 2 (two) times daily.      No current facility-administered medications for this visit.     Objective:  BP 122/64   Pulse 66   Temp 98 F (36.7 C)   Ht 5' (1.524 m)   Wt 122 lb 6.4 oz (55.5 kg)   SpO2 98%   BMI 23.90 kg/m  Gen: NAD, resting comfortably CV: RRR no murmurs rubs or gallops Lungs: CTAB no crackles, wheeze, rhonchi Ext: no edema Skin: warm, dry Neuro: grossly normal, moves all extremities      Assessment and Plan   #social update- had several months with daughters traveling to their homes - compression stockings help some with edema with flights -elevating legs helps  # Osteoporosis S: Last DEXA: 2015 mobility- 4 prong cane but occasionally uses walker for longer walks. No falls  Medication (bisphosphonate  or prolia): NONE  Calcium: '1200mg'$  (through diet ok) recommended - takes Vitamin D: 1000 units a day recommended- takes mixed with calcium Last vitamin D Lab Results  Component Value Date   VD25OH 39.00 07/12/2020   A/P: Osteoporosis likely poorly controlled- offered to check DEXA but she does not want to take medicine regardless of results- we focused on fall prevention instead and continuing calcium, vitamin D, walking as able- also works on an exercise mat 3x a week  #white coat elevated blood pressure S: medication:  none Home readings #s:  usually even lower than this morning- had not seen over 118 for 2 week period a few weeks ago BP Readings from Last 3 Encounters:  01/05/22 122/64- reading this am  07/12/20 118/62  07/01/19 (!) 150/80  A/P: excellent control without medicine- continue to monitor  #hyperlipidemia S: Medication:none  Lab Results  Component Value Date   CHOL 250 (H) 07/12/2020   HDL 70.30 07/12/2020   LDLCALC 153 (H) 07/12/2020   LDLDIRECT 140.5 09/07/2010   TRIG 134.0 07/12/2020   CHOLHDL 4 07/12/2020   A/P: mild elevations- but has wanted to work on healthy eating/exercise as able at her age- does not want to take statin- reports history of issues with statin- noted by Dr. Raliegh Ip in 2017  but no particular side effect listed.  -she prefers to remain off despite tortuous carotid arteries and <40% stenosis -does take asa once a week  # Memory issues S:has tendency to forget names- such as people or restaurants but will come ot her  A/P: wants to hold off on MRI or HIV/RPR testing- is ok with B12 and tsh testing   Recommended follow up: Return in about 1 year (around 01/06/2023) for followup or sooner if needed.Schedule b4 you leave.  Lab/Order associations: NOT fasting   ICD-10-CM   1. Memory loss  R41.3 Vitamin B12    2. Dyslipidemia  E78.5 CBC with Differential/Platelet    Comprehensive metabolic panel    Lipid panel    TSH    3. Other osteoporosis,  unspecified pathological fracture presence  M81.8 VITAMIN D 25 Hydroxy (Vit-D Deficiency, Fractures)     No orders of the defined types were placed in this encounter.  Return precautions advised.  Garret Reddish, MD

## 2022-01-05 NOTE — Patient Instructions (Addendum)
Health Maintenance Due  Topic Date Due   Medicare Annual Wellness (AWV)  05/24/2020  You are eligible to schedule your annual wellness visit with our nurse specialist Otila Kluver.  Please consider scheduling this before you leave today  Please stop by lab before you go If you have mychart- we will send your results within 3 business days of [place] receiving them.  If you do not have mychart- we will call you about results within 5 business days of [place] receiving them.  *please also note that you will see labs on mychart as soon as they post. I will later go in and write notes on them- will say "notes from Dr. Yong Channel"   Recommended follow up: Return in about 1 year (around 01/06/2023) for followup or sooner if needed.Schedule b4 you leave.

## 2022-01-23 DIAGNOSIS — H16223 Keratoconjunctivitis sicca, not specified as Sjogren's, bilateral: Secondary | ICD-10-CM | POA: Diagnosis not present

## 2022-01-23 DIAGNOSIS — H401131 Primary open-angle glaucoma, bilateral, mild stage: Secondary | ICD-10-CM | POA: Diagnosis not present

## 2022-01-23 DIAGNOSIS — Z961 Presence of intraocular lens: Secondary | ICD-10-CM | POA: Diagnosis not present

## 2022-01-23 DIAGNOSIS — H26491 Other secondary cataract, right eye: Secondary | ICD-10-CM | POA: Diagnosis not present

## 2022-01-24 ENCOUNTER — Ambulatory Visit (INDEPENDENT_AMBULATORY_CARE_PROVIDER_SITE_OTHER): Payer: Medicare Other | Admitting: Podiatry

## 2022-01-24 DIAGNOSIS — L603 Nail dystrophy: Secondary | ICD-10-CM

## 2022-01-24 NOTE — Progress Notes (Signed)
Patient presents today for the 8th laser treatment. Diagnosed with mycotic nail infection by Dr. Milinda Pointer.    Toenail most affected are all ten with the hallux right being the worst. Not much improvement.   All other systems are negative.   Nails were filed thin. Laser therapy was administered to 1-5 toenails bilateral and patient tolerated the treatment well. All safety precautions were in place.    Follow up in 8 weeks for laser # 9.

## 2022-01-30 ENCOUNTER — Ambulatory Visit: Payer: Medicare Other | Admitting: Podiatry

## 2022-01-30 ENCOUNTER — Encounter: Payer: Self-pay | Admitting: Podiatry

## 2022-01-30 MED ORDER — JUBLIA 10 % EX SOLN: 1.0000 [drp] | Freq: Every day | CUTANEOUS | 11 refills | Status: DC

## 2022-02-28 DIAGNOSIS — U071 COVID-19: Secondary | ICD-10-CM | POA: Insufficient documentation

## 2022-03-02 ENCOUNTER — Telehealth: Payer: Medicare Other | Admitting: Family Medicine

## 2022-03-02 ENCOUNTER — Telehealth: Payer: Self-pay | Admitting: Family Medicine

## 2022-03-02 DIAGNOSIS — U071 COVID-19: Secondary | ICD-10-CM | POA: Diagnosis not present

## 2022-03-02 MED ORDER — NIRMATRELVIR/RITONAVIR (PAXLOVID)TABLET: 3.0000 | ORAL_TABLET | Freq: Two times a day (BID) | ORAL | 0 refills | Status: AC

## 2022-03-02 NOTE — Progress Notes (Signed)
Virtual Visit Consent   Tina Schroeder, you are scheduled for a virtual visit with a [facility] provider today. Just as with appointments in the office, your consent must be obtained to participate. Your consent will be active for this visit and any virtual visit you may have with one of our providers in the next 365 days. If you have a MyChart account, a copy of this consent can be sent to you electronically.  As this is a virtual visit, video technology does not allow for your provider to perform a traditional examination. This may limit your provider's ability to fully assess your condition. If your provider identifies any concerns that need to be evaluated in person or the need to arrange testing (such as labs, EKG, etc.), we will make arrangements to do so. Although advances in technology are sophisticated, we cannot ensure that it will always work on either your end or our end. If the connection with a video visit is poor, the visit may have to be switched to a telephone visit. With either a video or telephone visit, we are not always able to ensure that we have a secure connection.  By engaging in this virtual visit, you consent to the provision of healthcare and authorize for your insurance to be billed (if applicable) for the services provided during this visit. Depending on your insurance coverage, you may receive a charge related to this service.  I need to obtain your verbal consent now. Are you willing to proceed with your visit today? Tina Schroeder has provided verbal consent on 03/02/2022 for a virtual visit (video or telephone). Tina Nims, FNP  Date: 03/02/2022 4:22 PM  Virtual Visit via Video Note   I, Tina Schroeder, connected with  Tina Schroeder  ([id number], [birth year]) on 03/02/22 at  4:15 PM EST by a video-enabled telemedicine application and verified that I am speaking with the correct person using two identifiers.  Location: Patient: Virtual Visit Location Patient:  Home Provider: Virtual Visit Location Provider: Office/Clinic   I discussed the limitations of evaluation and management by telemedicine and the availability of in person appointments. The patient expressed understanding and agreed to proceed.    History of Present Illness: Tina Schroeder is a 88 y.o. who identifies as a female who was assigned female at birth, and is being seen today for positive covid test today. Sx started Wednesday with sore throat, no wheezing, sob, cough. Her daughter Tina Schroeder in on the phone also. PT is up walking in her home in no distress. Marland Kitchen  HPI: HPI  Problems:  Patient Active Problem List   Diagnosis Date Noted   White coat syndrome without hypertension 05/24/2018   Glaucoma 05/24/2018   Dyslipidemia 07/08/2013   Basal cell carcinoma of face 02/24/2013   Right carotid bruit 10/09/2007   Osteoporosis 11/18/2006    Allergies:  Allergies  Allergen Reactions   Quinine Derivatives Palpitations    Nausea, weak   Medications:  Current Outpatient Medications:    JUBLIA 10 % SOLN, Apply 1 drop topically daily., Disp: 8 mL, Rfl: 11   nirmatrelvir/ritonavir (PAXLOVID) 20 x 150 MG & 10 x '100MG'$  TABS, Take 3 tablets by mouth 2 (two) times daily for 5 days. (Take nirmatrelvir 150 mg two tablets twice daily for 5 days and ritonavir 100 mg one tablet twice daily for 5 days) Patient GFR is 62.13, Disp: 30 tablet, Rfl: 0   aspirin EC 81 MG tablet, Take 81 mg by mouth once a week., Disp: , Rfl:  CALCIUM PO, Take by mouth., Disp: , Rfl:    dorzolamide-timolol (COSOPT) 22.3-6.8 MG/ML ophthalmic solution, INT 1 GTT INTO OU BID FOR 30 DAYS, Disp: , Rfl: 6   Multiple Vitamin (MULTIVITAMIN) tablet, Take 1 tablet by mouth daily., Disp: , Rfl:    RESTASIS 0.05 % ophthalmic emulsion, Place 1 drop into both eyes 2 (two) times daily. , Disp: , Rfl:   Observations/Objective: Patient is well-developed, well-nourished in no acute distress.  Resting comfortably  at home.  Head is  normocephalic, atraumatic.  No labored breathing.  Speech is clear and coherent with logical content.  Patient is alert and oriented at baseline.    Assessment and Plan: 1. COVID-19  Increase fluids, humidifier at night, tylneol, MVI with Vitd and zinc, quarantine discussed, urgent care if sx persist or worsen.   Follow Up Instructions: I discussed the assessment and treatment plan with the patient. The patient was provided an opportunity to ask questions and all were answered. The patient agreed with the plan and demonstrated an understanding of the instructions.  A copy of instructions were sent to the patient via MyChart unless otherwise noted below.     The patient was advised to call back or seek an in-person evaluation if the symptoms worsen or if the condition fails to improve as anticipated.  Time:  I spent 10 minutes with the patient via telehealth technology discussing the above problems/concerns.    Tina Nims, FNP

## 2022-03-02 NOTE — Patient Instructions (Signed)
Quarantine and Isolation Quarantine and isolation refer to local and travel restrictions to protect the public and travelers from contagious diseases that constitute a public health threat. Contagious diseases are diseases that can spread from one person to another. Quarantine and isolation help to protect the public by preventing exposure to people who have or may have a contagious disease. Isolation separates people who are sick with a contagious disease from people who are not sick. Quarantine separates and restricts the movement of people who were exposed to a contagious disease to see if they become sick. You may be put in quarantine or isolation if you have been exposed to or diagnosed with any of the following diseases: Severe acute respiratory syndromes, such as COVID-19. Cholera. Diphtheria. Tuberculosis. Plague. Smallpox. Yellow fever. Viral hemorrhagic fevers, such as [place], Ebola, and [place]. When to quarantine or isolate Follow these rules, whether you have been vaccinated or not: Stay home and isolate from others when you are sick with a contagious disease. Isolate when you test positive for a contagious disease, even if you do not have symptoms. Isolate if you are sick and suspect that you may have a contagious disease. If you suspect that you have a contagious disease, get tested. If your test results are negative, you can end your isolation. If your test results are positive, follow the full isolation recommendations as told by your health care provider or local health authorities. Quarantine and stay away from others when you have been in close contact with someone who has tested positive for a contagious disease. Close contact is defined as being less than 6 ft (1.8 m) away from an infected person for a total of 15 minutes or more over a 24-hour period. Do not go to places where you are unable to wear a mask, such as restaurants and some gyms. Stay home and separate  from others as much as possible. Avoid being around people who may get very sick from the contagious disease that you have. Use a separate bathroom, if possible. Do not travel. For travel guidance, visit the CDC's travel webpage at wwwnc.cdc.gov/travel/ Follow these instructions at home: Medicines  Take over-the-counter and prescription medicines as told by your health care provider. Finish all antibiotic medicine even when you start to feel better. Stay up to date with all your vaccines. Get scheduled vaccines and boosters as recommended by your health care provider. Lifestyle Wear a high-quality mask if you must be around others at home and in public, if recommended. Improve air flow (ventilation) at home to help prevent the disease from spreading to other people, if possible. Do not share personal household items, like cups, towels, and utensils. Practice everyday hygiene and cleaning. General instructions Talk to your health care provider if you have a weakened body defense system (immune system). People with a weakened immune system may have a reduced immune response to vaccines. You may need to follow current prevention measures, including wearing a well-fitting mask, avoiding crowds, and avoiding poorly ventilated indoor places. Monitor symptoms and follow health care provider instructions, which may include resting, drinking fluids, and taking medicines. Follow specific isolation and quarantine recommendations if you are in places that can lead to disease outbreaks, such as correctional and detention facilities, homeless shelters, and cruise ships. Return to your normal activities as told by your health care provider. Ask your health care provider what activities are safe for you. Keep all follow-up visits. This is important. Where to find more information CDC: www.cdc.gov/quarantine/index.html Contact   a health care provider if: You have a fever. You have signs and symptoms that  return or get worse after isolation. Get help right away if: You have difficulty breathing. You have chest pain. These symptoms may be an emergency. Get help right away. Call 911. Do not wait to see if the symptoms will go away. Do not drive yourself to the hospital. Summary Isolation and quarantine help protect the public by preventing exposure to people who have or may have a contagious disease. Isolate when you are sick or when you test positive, even if you do not have symptoms. Quarantine and stay away from others when you have been in close contact with someone who has tested positive for a contagious disease. This information is not intended to replace advice given to you by your health care provider. Make sure you discuss any questions you have with your health care provider. Document Revised: 03/09/2021 Document Reviewed: 02/16/2021 Elsevier Patient Education  [place] DJSHF-02, or coronavirus disease 2019, is an infection that is caused by a new (novel) coronavirus called SARS-CoV-2. COVID-19 can cause many symptoms. In some people, the virus may not cause any symptoms. In others, it may cause mild or severe symptoms. Some people with severe infection develop severe disease. What are the causes? This illness is caused by a virus. The virus may be in the air as tiny specks of fluid (aerosols) or droplets, or it may be on surfaces. You may catch the virus by: Breathing in droplets from an infected person. Droplets can be spread by a person breathing, speaking, singing, coughing, or sneezing. Touching something, like a table or a doorknob, that has virus on it (is contaminated) and then touching your mouth, nose, or eyes. What increases the risk? Risk for infection: You are more likely to get infected with the COVID-19 virus if: You are within 6 ft (1.8 m) of a person with COVID-19 for 15 minutes or longer. You are providing care for a person who is infected with  COVID-19. You are in close personal contact with other people. Close personal contact includes hugging, kissing, or sharing eating or drinking utensils. Risk for serious illness caused by COVID-19: You are more likely to get seriously ill from the COVID-19 virus if: You have cancer. You have a long-term (chronic) disease, such as: Chronic lung disease. This includes pulmonary embolism, chronic obstructive pulmonary disease, and cystic fibrosis. Long-term disease that lowers your body's ability to fight infection (immunocompromise). Serious cardiac conditions, such as heart failure, coronary artery disease, or cardiomyopathy. Diabetes. Chronic kidney disease. Liver diseases. These include cirrhosis, nonalcoholic fatty liver disease, alcoholic liver disease, or autoimmune hepatitis. You have obesity. You are pregnant or were recently pregnant. You have sickle cell disease. What are the signs or symptoms? Symptoms of this condition can range from mild to severe. Symptoms may appear any time from 2 to 14 days after being exposed to the virus. They include: Fever or chills. Shortness of breath or trouble breathing. Feeling tired or very tired. Headaches, body aches, or muscle aches. Runny or stuffy nose, sneezing, coughing, or sore throat. New loss of taste or smell. This is rare. Some people may also have stomach problems, such as nausea, vomiting, or diarrhea. Other people may not have any symptoms of COVID-19. How is this diagnosed? This condition may be diagnosed by testing samples to check for the COVID-19 virus. The most common tests are the PCR test and the antigen test. Tests may be done  in the lab or at home. They include: Using a swab to take a sample of fluid from the back of your nose and throat (nasopharyngeal fluid), from your nose, or from your throat. Testing a sample of saliva from your mouth. Testing a sample of coughed-up mucus from your lungs (sputum). How is this  treated? Treatment for COVID-19 infection depends on the severity of the condition. Mild symptoms can be managed at home with rest, fluids, and over-the-counter medicines. Serious symptoms may be treated in a hospital intensive care unit (ICU). Treatment in the ICU may include: Supplemental oxygen. Extra oxygen is given through a tube in the nose, a face mask, or a hood. Medicines. These may include: Antivirals, such as monoclonal antibodies. These help your body fight off certain viruses that can cause disease. Anti-inflammatories, such as corticosteroids. These reduce inflammation and suppress the immune system. Antithrombotics. These prevent or treat blood clots, if they develop. Convalescent plasma. This helps boost your immune system, if you have an underlying immunosuppressive condition or are getting immunosuppressive treatments. Prone positioning. This means you will lie on your stomach. This helps oxygen to get into your lungs. Infection control measures. If you are at risk for more serious illness caused by COVID-19, your health care provider may prescribe two long-acting monoclonal antibodies, given together every 6 months. How is this prevented? To protect yourself: Use preventive medicine (pre-exposure prophylaxis). You may get pre-exposure prophylaxis if you have moderate or severe immunocompromise. Get vaccinated. Anyone 69 months old or older who meets guidelines can get a COVID-19 vaccine or vaccine series. This includes people who are pregnant or making breast milk (lactating). Get an added dose of COVID-19 vaccine after your first vaccine or vaccine series if you have moderate to severe immunocompromise. This applies if you have had a solid organ transplant or have been diagnosed with an immunocompromising condition. You should get the added dose 4 weeks after you got the first COVID-19 vaccine or vaccine series. If you get an mRNA vaccine, you will need a 3-dose primary  series. If you get the J&J/Janssen vaccine, you will need a 2-dose primary series, with the second dose being an mRNA vaccine. Talk to your health care provider about getting experimental monoclonal antibodies. This treatment is approved under emergency use authorization to prevent severe illness before or after being exposed to the COVID-19 virus. You may be given monoclonal antibodies if: You have moderate or severe immunocompromise. This includes treatments that lower your immune response. People with immunocompromise may not develop protection against COVID-19 when they are vaccinated. You cannot be vaccinated. You may not get a vaccine if you have a severe allergic reaction to the vaccine or its components. You are not fully vaccinated. You are in a facility where COVID-19 is present and: Are in close contact with a person who is infected with the COVID-19 virus. Are at high risk of being exposed to the COVID-19 virus. You are at risk of illness from new variants of the COVID-19 virus. To protect others: If you have symptoms of COVID-19, take steps to prevent the virus from spreading to others. Stay home. Leave your house only to get medical care. Do not use public transit, if possible. Do not travel while you are sick. Wash your hands often with soap and water for at least 20 seconds. If soap and water are not available, use alcohol-based hand sanitizer. Make sure that all people in your household wash their hands well and often. Cough or sneeze  into a tissue or your sleeve or elbow. Do not cough or sneeze into your hand or into the air. Where to find more information Centers for Disease Control and Prevention: CharmCourses.be World Health Organization: https://www.castaneda.info/ Get help right away if: You have trouble breathing. You have pain or pressure in your chest. You are confused. You have bluish lips and fingernails. You have trouble waking from sleep. You  have symptoms that get worse. These symptoms may be an emergency. Get help right away. Call 911. Do not wait to see if the symptoms will go away. Do not drive yourself to the hospital. Summary COVID-19 is an infection that is caused by a new coronavirus. Sometimes, there are no symptoms. Other times, symptoms range from mild to severe. Some people with a severe COVID-19 infection develop severe disease. The virus that causes COVID-19 can spread from person to person through droplets or aerosols from breathing, speaking, singing, coughing, or sneezing. Mild symptoms of COVID-19 can be managed at home with rest, fluids, and over-the-counter medicines. This information is not intended to replace advice given to you by your health care provider. Make sure you discuss any questions you have with your health care provider. Document Revised: 02/14/2021 Document Reviewed: 02/16/2021 Elsevier Patient Education  [place].

## 2022-03-02 NOTE — Telephone Encounter (Signed)
Pt is Covid positive and daughter would like a call back with details on what to do with symptoms. Sore throat and low grade fever.

## 2022-03-02 NOTE — Telephone Encounter (Signed)
Patient's daughter Santiago Glad requests to be called at ph# [phone] asap re: Patient has tested positive for covid.

## 2022-03-06 NOTE — Telephone Encounter (Signed)
Call and schedule virtual for pt.

## 2022-03-06 NOTE — Telephone Encounter (Signed)
Patient was seen virtually by Dellia Nims on 03/02/22 @ 4:15pm.

## 2022-04-10 ENCOUNTER — Ambulatory Visit (INDEPENDENT_AMBULATORY_CARE_PROVIDER_SITE_OTHER): Payer: Medicare Other | Admitting: Podiatry

## 2022-04-10 DIAGNOSIS — B351 Tinea unguium: Secondary | ICD-10-CM | POA: Diagnosis not present

## 2022-04-10 DIAGNOSIS — L603 Nail dystrophy: Secondary | ICD-10-CM

## 2022-04-10 DIAGNOSIS — M79674 Pain in right toe(s): Secondary | ICD-10-CM | POA: Diagnosis not present

## 2022-04-10 DIAGNOSIS — M79675 Pain in left toe(s): Secondary | ICD-10-CM | POA: Diagnosis not present

## 2022-04-10 NOTE — Progress Notes (Unsigned)
  Subjective:  Patient ID: Tina Schroeder, female    DOB: May 01, 1923,  MRN: [id number]  Tina Schroeder presents to clinic today for painful elongated overgrown toenails which are tender when wearing enclosed shoe gear. Patient has tried laser treatment with no improvement. She states Jublia was presented as an option, but the cost of the medication was too expensive.  She also presents with c/o left 2nd toe rubbing on great toe and causing discomfort.  Chief Complaint  Patient presents with   Nail Problem    RFC PCP-Stephen Hunter PCP VST-07/2021   New problem(s): None.   PCP is Marin Olp, MD.  Allergies  Allergen Reactions   Quinine Derivatives Palpitations    Nausea, weak   Review of Systems: Negative except as noted in the HPI.  Objective: No changes noted in today's physical examination. There were no vitals filed for this visit. Tina Schroeder is a pleasant 89 y.o. female WD, WN in NAD. AAO x 3.  Vascular Examination: Capillary refill time immediate to digits b/l. Vascular status intact b/l with palpable pedal pulses. Pedal hair absent b/l. No edema. No pain with calf compression b/l. Skin temperature gradient WNL b/l. No cyanosis or clubbing noted b/l LE.  Neurological Examination: Sensation grossly intact b/l with 10 gram monofilament. Vibratory sensation intact b/l.   Dermatological Examination: Toenails 1-5 b/l thick, discolored, elongated with subungual debris and pain on dorsal palpation.   Pedal skin is warm and supple b/l LE. No open wounds b/l LE. No interdigital macerations noted b/l LE. No hyperkeratotic nor porokeratotic lesions present on today's visit.  Musculoskeletal Examination: Normal muscle strength 5/5 to all lower extremity muscle groups bilaterally. No pain, crepitus or joint limitation noted with ROM b/l LE. No gross bony pedal deformities b/l. Patient ambulates independently without assistive aids.  Radiographs: None  Assessment/Plan: 1.  Pain due to onychomycosis of toenails of both feet    -Patient was evaluated and treated. All patient's and/or POA's questions/concerns answered on today's visit. Tina Schroeder was too expensive. -Patient to continue soft, supportive shoe gear daily. -Discussed treatment options for onychomycosis. Patient opted for topical OTC therapy. Patient is to apply Vick's Vapor rub with cotton tipped applicator to affected toenail(s) once daily. -Toenails 1-5 b/l were debrided in length and girth with sterile nail nippers and dremel without iatrogenic bleeding.  -Dispensed tube foam for abutting toes b/l great toes, bilateral 2nd toes. Apply to bilateral great toes every morning. Remove every evening. -Patient/POA to call should there be question/concern in the interim.   Return in about 3 months (around 07/10/2022).  Marzetta Board, DPM

## 2022-04-12 ENCOUNTER — Encounter: Payer: Self-pay | Admitting: Podiatry

## 2022-06-15 ENCOUNTER — Ambulatory Visit (INDEPENDENT_AMBULATORY_CARE_PROVIDER_SITE_OTHER): Payer: Medicare Other | Admitting: Podiatry

## 2022-06-15 ENCOUNTER — Encounter: Payer: Self-pay | Admitting: Podiatry

## 2022-06-15 DIAGNOSIS — B351 Tinea unguium: Secondary | ICD-10-CM | POA: Diagnosis not present

## 2022-06-15 DIAGNOSIS — M79674 Pain in right toe(s): Secondary | ICD-10-CM

## 2022-06-15 DIAGNOSIS — M79675 Pain in left toe(s): Secondary | ICD-10-CM | POA: Diagnosis not present

## 2022-06-17 NOTE — Progress Notes (Signed)
  Subjective:  Patient ID: Tina Schroeder, female    DOB: 1924-01-01,  MRN: [id number]  Tina Schroeder presents to clinic today for painful thick toenails that are difficult to trim. Pain interferes with ambulation. Aggravating factors include wearing enclosed shoe gear. Pain is relieved with periodic professional debridement.   Patient states her left great toe is tender from pressure of left 2nd toe abutting digit.   New problem(s): None.   PCP is Shelva Majestic, MD.  Allergies  Allergen Reactions   Quinine Derivatives Palpitations    Nausea, weak    Review of Systems: Negative except as noted in the HPI. Objective:  Vascular Examination: Capillary refill time immediate to digits b/l. Vascular status intact b/l with palpable pedal pulses. Pedal hair absent b/l. No edema. No pain with calf compression b/l. Skin temperature gradient WNL b/l. No cyanosis or clubbing noted b/l LE.  Neurological Examination: Sensation grossly intact b/l with 10 gram monofilament. Vibratory sensation intact b/l.   Dermatological Examination: Toenails 1-5 b/l thick, discolored, elongated with subungual debris and pain on dorsal palpation.   Pedal skin is warm and supple b/l LE. No open wounds b/l LE. No interdigital macerations noted b/l LE. No hyperkeratotic nor porokeratotic lesions present on today's visit.  Musculoskeletal Examination: Overlapping 2nd toes applying pressure to nailplate of b/l great toes. HAV with bunion deformity b/l. Normal muscle strength 5/5 to all lower extremity muscle groups bilaterally. No pain, crepitus or joint limitation noted with ROM b/l LE. Patient ambulates independently without assistive aids.  Radiographs: None  Assessment:   1. Pain due to onychomycosis of toenails of both feet    Plan:  Patient was evaluated and treated and all questions answered. Consent given for treatment as described below: -Consent given for treatment as described below: -Examined  patient. -Dispensed new tube foam for protection of b/l great toes daily. -Mycotic toenails were debrided in length and girth 1-5 bilaterally with sterile nail nippers and dremel without iatrogenic bleeding. Continue Vick's Vapor Rub to affected toenail(s) once daily. -Patient/POA to call should there be question/concern in the interim.  Return in about 3 months (around 09/14/2022).  Freddie Breech, DPM

## 2022-08-12 IMAGING — MG DIGITAL SCREENING BILAT W/ TOMO W/ CAD
6 of 10 series · 6 of 30 positions shown · non-contrast
Comparison: Previous exam(s).

CLINICAL DATA: Screening.

EXAM:
DIGITAL SCREENING BILATERAL MAMMOGRAM WITH TOMO AND CAD

[L MLO synth-2D (1 of 2)]
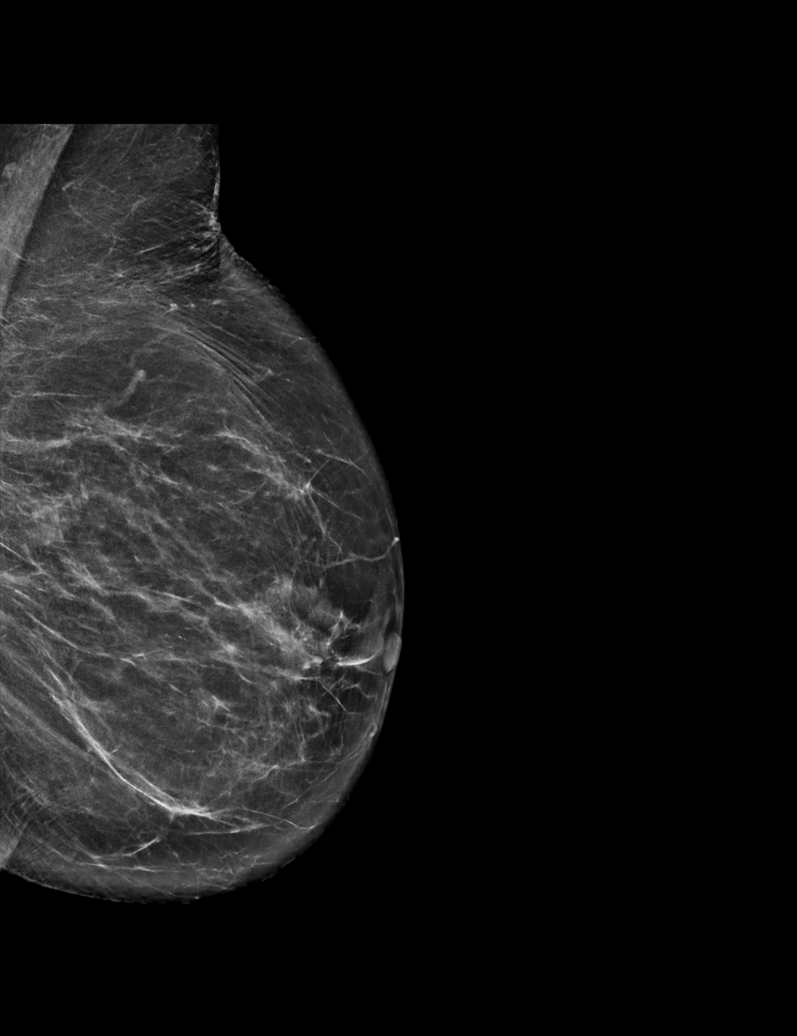

[L CC synth-2D]
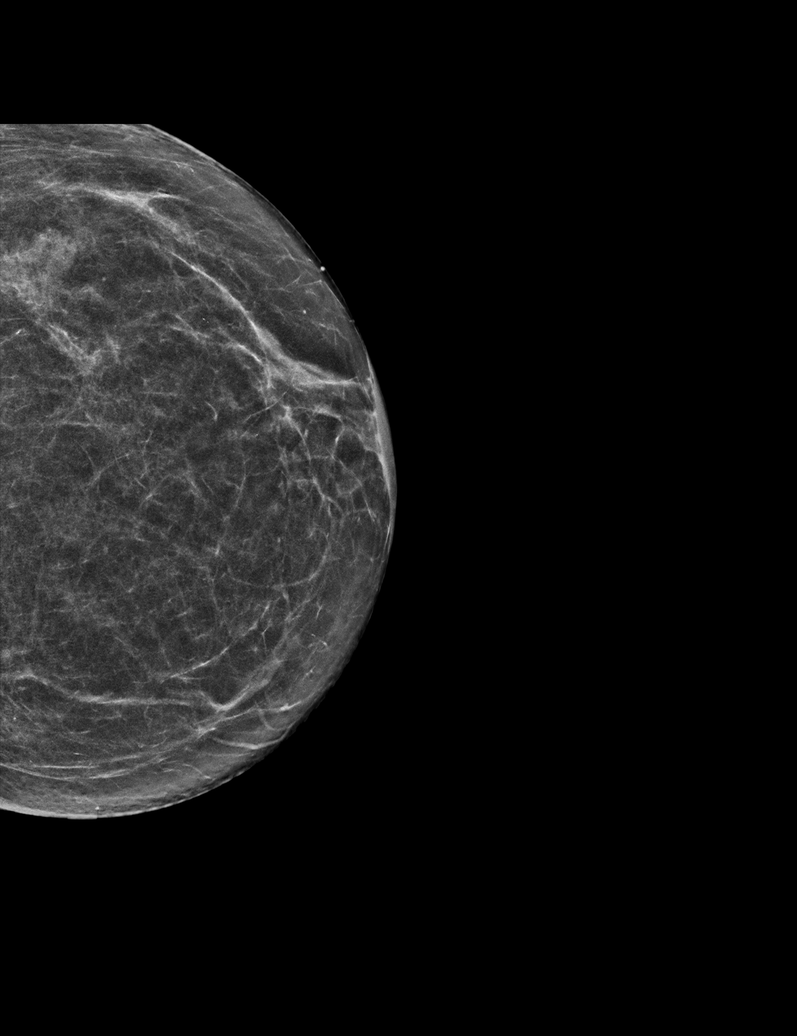

[R CC synth-2D]
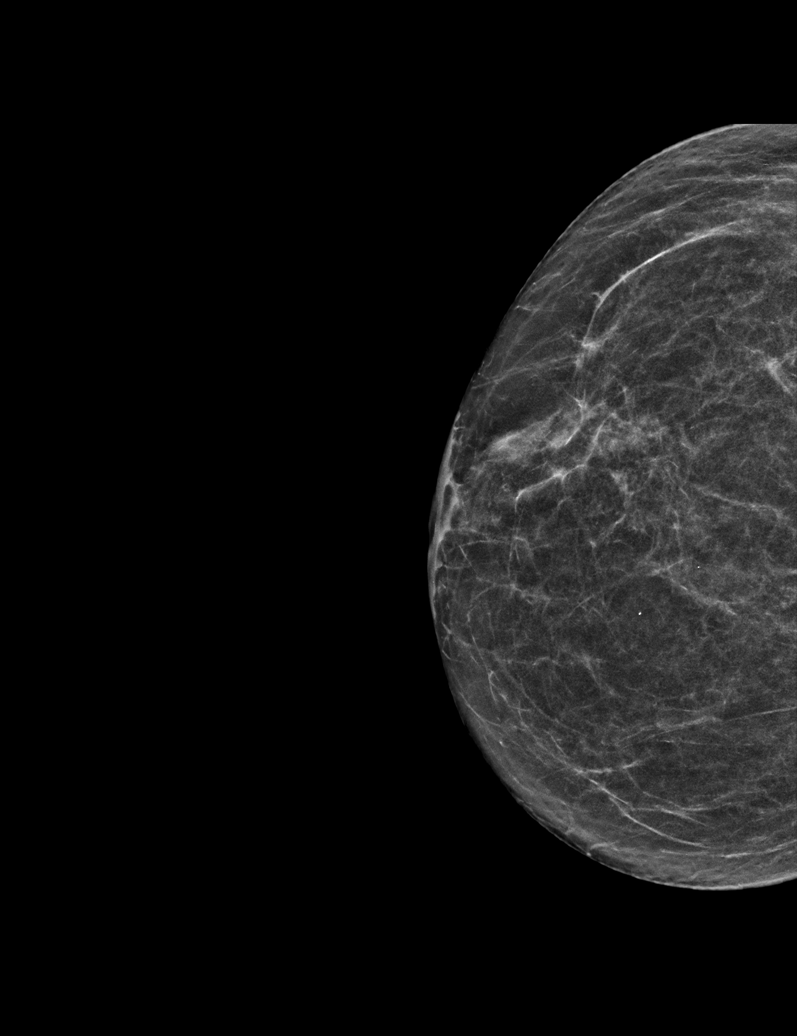

[L MLO synth-2D (2 of 2)]
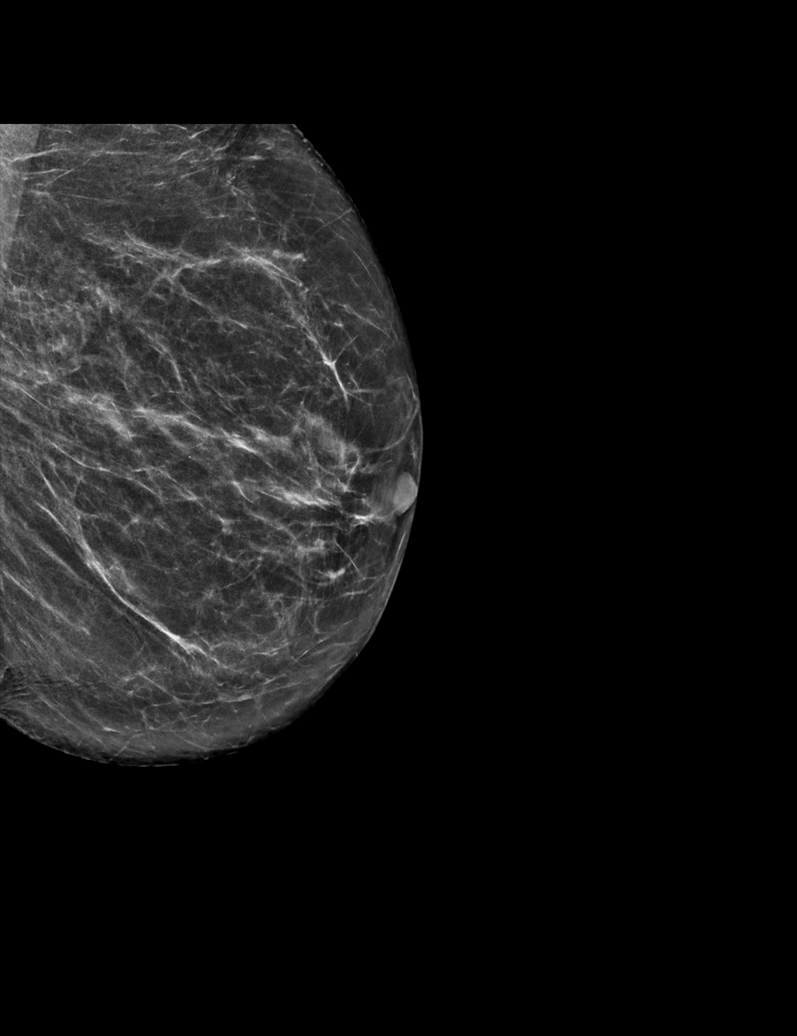

[R MLO synth-2D]
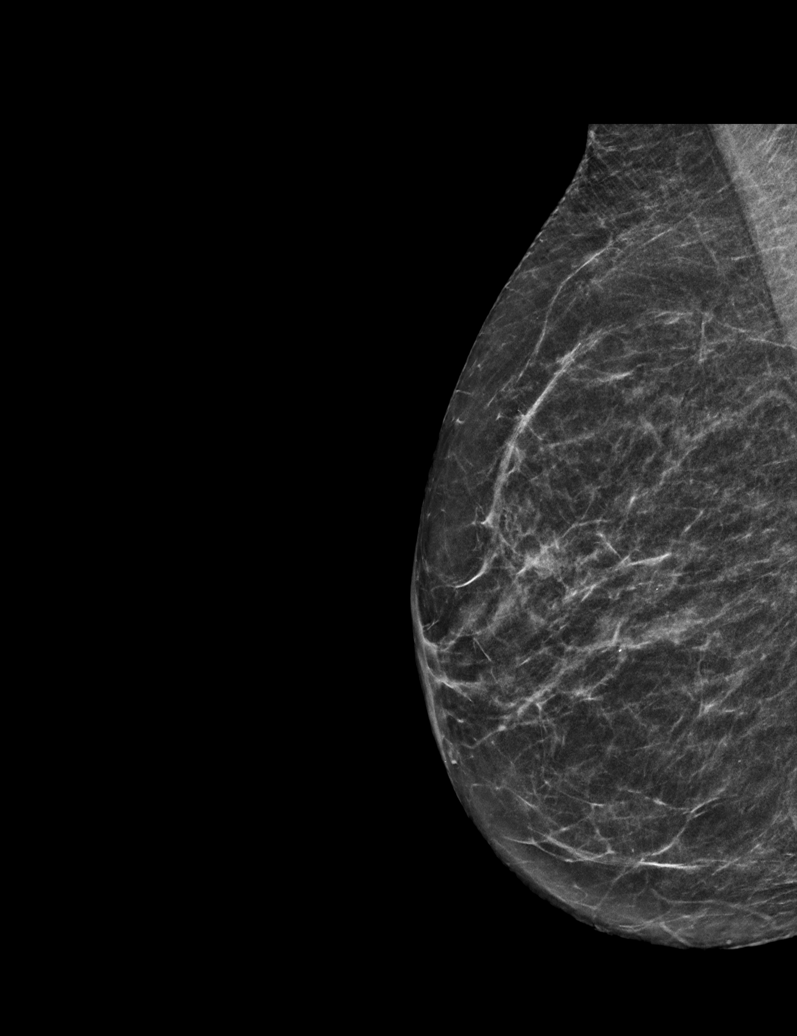

[R CC tomo · tomo slice 23/46.0]
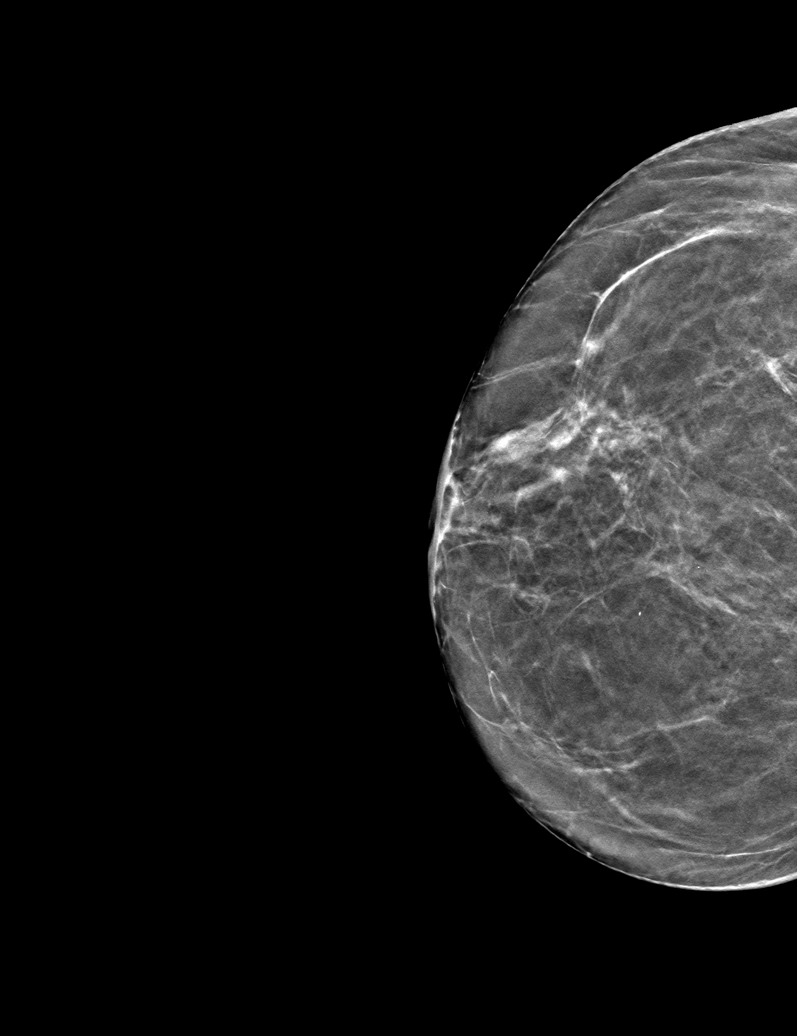

[6 of 30 positions shown; findings below may reference images not displayed]

ACR Breast Density Category b: There are scattered areas of
fibroglandular density.
FINDINGS: There are no findings suspicious for malignancy. Images were
processed with CAD.
IMPRESSION: No mammographic evidence of malignancy. A result letter of this
screening mammogram will be mailed directly to the patient.

RECOMMENDATION:
Screening mammogram in one year. (Code:[id number])

BI-RADS CATEGORY  1: Negative.

## 2022-10-23 ENCOUNTER — Encounter: Payer: Self-pay | Admitting: Podiatry

## 2022-10-23 ENCOUNTER — Ambulatory Visit: Payer: Medicare Other | Admitting: Podiatry

## 2022-10-23 DIAGNOSIS — M79674 Pain in right toe(s): Secondary | ICD-10-CM

## 2022-10-23 DIAGNOSIS — H401131 Primary open-angle glaucoma, bilateral, mild stage: Secondary | ICD-10-CM | POA: Diagnosis not present

## 2022-10-23 DIAGNOSIS — M79675 Pain in left toe(s): Secondary | ICD-10-CM | POA: Diagnosis not present

## 2022-10-23 DIAGNOSIS — H26491 Other secondary cataract, right eye: Secondary | ICD-10-CM | POA: Diagnosis not present

## 2022-10-23 DIAGNOSIS — B351 Tinea unguium: Secondary | ICD-10-CM

## 2022-10-23 DIAGNOSIS — H16223 Keratoconjunctivitis sicca, not specified as Sjogren's, bilateral: Secondary | ICD-10-CM | POA: Diagnosis not present

## 2022-10-23 DIAGNOSIS — Z961 Presence of intraocular lens: Secondary | ICD-10-CM | POA: Diagnosis not present

## 2022-10-23 NOTE — Progress Notes (Signed)
  Subjective:  Patient ID: Tina Schroeder, female    DOB: 1923-11-18,  MRN: [id number]  89 y.o. female presents to clinic with painful elongated mycotic toenails 1-5 bilaterally which are tender when wearing enclosed shoe gear. Pain is relieved with periodic professional debridement. Chief Complaint  Patient presents with   RFC    RFC    New problem(s): None   PCP is Shelva Majestic, MD.  Allergies  Allergen Reactions   Quinine Derivatives Palpitations    Nausea, weak    Review of Systems: Negative except as noted in the HPI.   Objective:  Tina Schroeder is a pleasant 89 y.o. female WD, WN in NAD.Marland Kitchen  Vascular Examination: Vascular status intact b/l with palpable pedal pulses. CFT immediate b/l. No edema. No pain with calf compression b/l. Skin temperature gradient WNL b/l. Pedal hair absent. No cyanosis or clubbing noted b/l LE.  Neurological Examination: Sensation grossly intact b/l with 10 gram monofilament. Vibratory sensation intact b/l.   Dermatological Examination: Pedal skin with normal turgor, texture and tone b/l. Toenails 1-5 b/l thick, discolored, elongated with subungual debris and pain on dorsal palpation. No hyperkeratotic lesions noted b/l.   Musculoskeletal Examination: Muscle strength 5/5 to b/l LE. HAV with bunion deformity noted b/l LE. Crossover hammertoe deformity noted bilateral 2nd toes.  Radiographs: None  Assessment:   1. Pain due to onychomycosis of toenails of both feet    Plan:  -Consent given for treatment as described below: -Examined patient. -Patient to continue soft, supportive shoe gear daily. -Toenails 1-5 b/l were debrided in length and girth with sterile nail nippers and dremel without iatrogenic bleeding.  -Patient/POA to call should there be question/concern in the interim.  Return in about 3 months (around 01/23/2023).  Freddie Breech, DPM

## 2022-12-16 DIAGNOSIS — Z23 Encounter for immunization: Secondary | ICD-10-CM | POA: Diagnosis not present

## 2022-12-26 ENCOUNTER — Ambulatory Visit (INDEPENDENT_AMBULATORY_CARE_PROVIDER_SITE_OTHER): Payer: Medicare Other | Admitting: Podiatry

## 2022-12-26 ENCOUNTER — Encounter: Payer: Self-pay | Admitting: Podiatry

## 2022-12-26 DIAGNOSIS — M79674 Pain in right toe(s): Secondary | ICD-10-CM | POA: Diagnosis not present

## 2022-12-26 DIAGNOSIS — B351 Tinea unguium: Secondary | ICD-10-CM | POA: Diagnosis not present

## 2022-12-26 DIAGNOSIS — M79675 Pain in left toe(s): Secondary | ICD-10-CM

## 2022-12-26 NOTE — Progress Notes (Signed)
  Subjective:  Patient ID: Tina Schroeder, female    DOB: [birth year],  MRN: [id number]  89 y.o. female presents to clinic with painful elongated mycotic toenails 1-5 bilaterally which are tender when wearing enclosed shoe gear. Pain is relieved with periodic professional debridement. Chief Complaint  Patient presents with   Nail Problem    RFC.    New problem(s): None   PCP is Shelva Majestic, MD.  Allergies  Allergen Reactions   Quinine Derivatives Palpitations    Nausea, weak    Review of Systems: Negative except as noted in the HPI.   Objective:  Laresa Oshiro is a pleasant 89 y.o. female WD, WN in NAD.Marland Kitchen  Vascular Examination: Vascular status intact b/l with palpable pedal pulses. CFT immediate b/l. No edema. No pain with calf compression b/l. Skin temperature gradient WNL b/l. Pedal hair absent. No cyanosis or clubbing noted b/l LE.  Neurological Examination: Sensation grossly intact b/l with 10 gram monofilament. Vibratory sensation intact b/l.   Dermatological Examination: Pedal skin with normal turgor, texture and tone b/l. Toenails 1-5 b/l thick, discolored, elongated with subungual debris and pain on dorsal palpation. No hyperkeratotic lesions noted b/l.   Musculoskeletal Examination: Muscle strength 5/5 to b/l LE. HAV with bunion deformity noted b/l LE. Crossover hammertoe deformity noted bilateral 2nd toes.  Radiographs: None  Assessment:   1. Pain due to onychomycosis of toenails of both feet    Plan:  -Consent given for treatment as described below: -Examined patient. -Patient to continue soft, supportive shoe gear daily. -Toenails 1-5 b/l were debrided in length and girth with sterile nail nippers and dremel without iatrogenic bleeding.  -Patient/POA to call should there be question/concern in the interim.  No follow-ups on file.  Louann Sjogren, DPM

## 2023-01-07 ENCOUNTER — Ambulatory Visit: Payer: Medicare Other | Admitting: Family Medicine

## 2023-01-18 DIAGNOSIS — H903 Sensorineural hearing loss, bilateral: Secondary | ICD-10-CM | POA: Diagnosis not present

## 2023-01-21 ENCOUNTER — Encounter: Payer: Self-pay | Admitting: Family Medicine

## 2023-01-29 ENCOUNTER — Ambulatory Visit: Payer: Medicare Other | Admitting: Podiatry

## 2023-02-11 ENCOUNTER — Encounter: Payer: Self-pay | Admitting: Family Medicine

## 2023-02-11 ENCOUNTER — Ambulatory Visit (INDEPENDENT_AMBULATORY_CARE_PROVIDER_SITE_OTHER): Payer: Medicare Other | Admitting: Family Medicine

## 2023-02-11 VITALS — BP 124/66 | HR 65 | Temp 97.7°F | Ht 60.0 in | Wt 118.6 lb

## 2023-02-11 DIAGNOSIS — E785 Hyperlipidemia, unspecified: Secondary | ICD-10-CM

## 2023-02-11 DIAGNOSIS — R030 Elevated blood-pressure reading, without diagnosis of hypertension: Secondary | ICD-10-CM

## 2023-02-11 DIAGNOSIS — M818 Other osteoporosis without current pathological fracture: Secondary | ICD-10-CM

## 2023-02-11 LAB — COMPREHENSIVE METABOLIC PANEL
ALT: 21 U/L (ref 0–35)
AST: 27 U/L (ref 0–37)
Albumin: 4.2 g/dL (ref 3.5–5.2)
Alkaline Phosphatase: 70 U/L (ref 39–117)
BUN: 22 mg/dL (ref 6–23)
CO2: 28 meq/L (ref 19–32)
Calcium: 10.3 mg/dL (ref 8.4–10.5)
Chloride: 104 meq/L (ref 96–112)
Creatinine, Ser: 0.73 mg/dL (ref 0.40–1.20)
GFR: 68.81 mL/min (ref >60.00)
Glucose, Bld: 95 mg/dL (ref 70–99)
Potassium: 4.1 meq/L (ref 3.5–5.1)
Sodium: 138 meq/L (ref 135–145)
Total Bilirubin: 0.7 mg/dL (ref 0.2–1.2)
Total Protein: 7.1 g/dL (ref 6.0–8.3)

## 2023-02-11 LAB — LIPID PANEL
Cholesterol: 205 mg/dL — ABNORMAL HIGH (ref 0–200)
HDL: 74.5 mg/dL (ref >39.00)
LDL Cholesterol: 112 mg/dL — ABNORMAL HIGH (ref 0–99)
NonHDL: 130.18
Total CHOL/HDL Ratio: 3
Triglycerides: 89 mg/dL (ref 0.0–149.0)
VLDL: 17.8 mg/dL (ref 0.0–40.0)

## 2023-02-11 LAB — VITAMIN D 25 HYDROXY (VIT D DEFICIENCY, FRACTURES): VITD: 47.51 ng/mL (ref 30.00–100.00)

## 2023-02-11 LAB — CBC WITH DIFFERENTIAL/PLATELET
Basophils Absolute: 0.1 10*3/uL (ref 0.0–0.1)
Basophils Relative: 1.3 % (ref 0.0–3.0)
Eosinophils Absolute: 0.1 10*3/uL (ref 0.0–0.7)
Eosinophils Relative: 1.2 % (ref 0.0–5.0)
HCT: 40.7 % (ref 36.0–46.0)
Hemoglobin: 13.7 g/dL (ref 12.0–15.0)
Lymphocytes Relative: 20.5 % (ref 12.0–46.0)
Lymphs Abs: 1.1 10*3/uL (ref 0.7–4.0)
MCHC: 33.7 g/dL (ref 30.0–36.0)
MCV: 94.4 fL (ref 78.0–100.0)
Monocytes Absolute: 0.4 10*3/uL (ref 0.1–1.0)
Monocytes Relative: 7.5 % (ref 3.0–12.0)
Neutro Abs: 3.8 10*3/uL (ref 1.4–7.7)
Neutrophils Relative %: 69.5 % (ref 43.0–77.0)
Platelets: 247 10*3/uL (ref 150.0–400.0)
RBC: 4.31 Mil/uL (ref 3.87–5.11)
RDW: 14 % (ref 11.5–15.5)
WBC: 5.5 10*3/uL (ref 4.0–10.5)

## 2023-02-11 LAB — TSH: TSH: 2.22 u[IU]/mL (ref 0.35–5.50)

## 2023-02-11 NOTE — Progress Notes (Signed)
Phone [phone] In person visit   Subjective:   Tina Schroeder is a 89 y.o. year old very pleasant female patient who presents for/with See problem oriented charting Chief Complaint  Patient presents with   Annual Exam    Not fasting (denies issues/ROS)   Past Medical History-  Patient Active Problem List   Diagnosis Date Noted   Osteoporosis 11/18/2006    Priority: High   White coat syndrome without hypertension 05/24/2018    Priority: Medium    Glaucoma 05/24/2018    Priority: Medium    Dyslipidemia 07/08/2013    Priority: Medium    Basal cell carcinoma of face 02/24/2013    Priority: Low   Right carotid bruit 10/09/2007    Priority: Low    Medications- reviewed and updated Current Outpatient Medications  Medication Sig Dispense Refill   aspirin EC 81 MG tablet Take 81 mg by mouth once a week.     CALCIUM PO Take by mouth.     dorzolamide-timolol (COSOPT) 22.3-6.8 MG/ML ophthalmic solution INT 1 GTT INTO OU BID FOR 30 DAYS  6   Multiple Vitamin (MULTIVITAMIN) tablet Take 1 tablet by mouth daily.     RESTASIS 0.05 % ophthalmic emulsion Place 1 drop into both eyes 2 (two) times daily.      No current facility-administered medications for this visit.     Objective:  BP 124/66 Comment: most recent home reading  Pulse 65   Temp 97.7 F (36.5 C)   Ht 5' (1.524 m)   Wt 118 lb 9.6 oz (53.8 kg)   SpO2 98%   BMI 23.16 kg/m  Gen: NAD, resting comfortably Tympanic membrane normal bilaterally, oropharynx normal CV: RRR no murmurs rubs or gallops Lungs: CTAB no crackles, wheeze, rhonchi Abdomen: soft/nontender/nondistended/normal bowel sounds. No rebound or guarding.  Ext: trace edema Skin: warm, dry Neuro: grossly normal, moves all extremities    Assessment and Plan   #fatigue- has noted some more fatigue in last year. No chest pain or shortness of breath . Check CBC, CMP, TSH  - no aches or pains  #hyperlipidemia S: Medication:none  Lab Results  Component  Value Date   CHOL 203 (H) 01/05/2022   HDL 75.80 01/05/2022   LDLCALC 110 (H) 01/05/2022   LDLDIRECT 140.5 09/07/2010   TRIG 83.0 01/05/2022   CHOLHDL 3 01/05/2022   A/P: at hear age would not likely start medicine unless evidence of heart attack or stroke  # Memory loss-noted last year-B12 and TSH were normal -Had wanted to hold off on MRI or HIV/RPR testing. - perhaps mild worsening in last year -offered MMSE but she declines for now   # Osteoporosis S: Last DEXA: Done in 2015. Mainly using walker as long as she has space but will still use 4 prong cane if in tighter quarters.   Thankfully no falls   Medication (bisphosphonate or prolia): None- not interested  Calcium: 1200mg  (through diet ok) recommended-takes Vitamin D: 1000 units a day recommended-takes Last vitamin D Lab Results  Component Value Date   VD25OH 39.29 01/05/2022   A/P: still not interested in medicine- wants to hold off on further workup as result- is ok with at least checking vitamin D   #white coat syndrome- blood pressure actually better today though diastolic at 80 on my check compared to 66 on home check   #Health maintenance - had flu shot at [facility] 12/16/22. Wants to hold off on shingrix and COVID.  Immunization History  Administered Date(s) Administered  Fluad Quad(high Dose 65+) 01/09/2021, 11/16/2021, 12/16/2021   Influenza Split 01/03/2011, 06/24/2012   Influenza Whole 12/14/2009   Influenza, High Dose Seasonal PF 11/20/2014, 12/09/2015, 12/07/2016, 12/26/2017, 12/31/2018   Influenza-Unspecified 01/24/2014, 12/10/2017, 01/03/2018   Janssen (J&J) SARS-COV-2 Vaccination 05/23/2019   PFIZER(Purple Top)SARS-COV-2 Vaccination 01/23/2020   Pneumococcal Conjugate-13 02/10/2002, 07/08/2013   Pneumococcal Polysaccharide-23 03/12/2002   Td 01/10/2009   Tdap 07/01/2019   Zoster, Live 10/24/2015  - still sees eye doctor for dry eye - still sees dermatology at least as needed  Recommended follow  up: Return in about 1 year (around 02/11/2024) for physical or sooner if needed.Schedule b4 you leave. Future Appointments  Date Time Provider Department Center  02/27/2023 11:15 AM Louann Sjogren, DPM [facility]  05/01/2023 10:45 AM Freddie Breech, DPM [facility]  07/03/2023 11:15 AM Freddie Breech, DPM [facility]   Lab/Order associations: NOT fasting   ICD-10-CM   1. Other osteoporosis, unspecified pathological fracture presence  M81.8 VITAMIN D 25 Hydroxy (Vit-D Deficiency, Fractures)    2. Dyslipidemia  E78.5 Comprehensive metabolic panel    CBC with Differential/Platelet    Lipid panel    TSH    3. White coat syndrome without hypertension  R03.0       No orders of the defined types were placed in this encounter.   Return precautions advised.  Tana Conch, MD

## 2023-02-11 NOTE — Patient Instructions (Addendum)
Please stop by lab before you go If you have mychart- we will send your results within 3 business days of [place] receiving them.  If you do not have mychart- we will call you about results within 5 business days of [place] receiving them.  *please also note that you will see labs on mychart as soon as they post. I will later go in and write notes on them- will say "notes from Dr. Durene Cal"   Recommended follow up: Return in about 1 year (around 02/11/2024) for physical or sooner if needed.Schedule b4 you leave.

## 2023-02-27 ENCOUNTER — Encounter: Payer: Self-pay | Admitting: Podiatry

## 2023-02-27 ENCOUNTER — Ambulatory Visit (INDEPENDENT_AMBULATORY_CARE_PROVIDER_SITE_OTHER): Payer: Medicare Other | Admitting: Podiatry

## 2023-02-27 DIAGNOSIS — M79675 Pain in left toe(s): Secondary | ICD-10-CM

## 2023-02-27 DIAGNOSIS — B351 Tinea unguium: Secondary | ICD-10-CM | POA: Diagnosis not present

## 2023-02-27 DIAGNOSIS — M79674 Pain in right toe(s): Secondary | ICD-10-CM

## 2023-02-27 DIAGNOSIS — Z91199 Patient's noncompliance with other medical treatment and regimen due to unspecified reason: Secondary | ICD-10-CM

## 2023-02-27 NOTE — Progress Notes (Signed)
  Subjective:  Patient ID: Tina Schroeder, female    DOB: 09-15-1923,  MRN: [id number]  89 y.o. female presents to clinic with painful elongated mycotic toenails 1-5 bilaterally which are tender when wearing enclosed shoe gear. Pain is relieved with periodic professional debridement. No chief complaint on file.   New problem(s): None   PCP is Shelva Majestic, MD.  Allergies  Allergen Reactions   Quinine Derivatives Palpitations    Nausea, weak    Review of Systems: Negative except as noted in the HPI.   Objective:  Tina Schroeder is a pleasant 89 y.o. female WD, WN in NAD.Marland Kitchen  Vascular Examination: Vascular status intact b/l with palpable pedal pulses. CFT immediate b/l. No edema. No pain with calf compression b/l. Skin temperature gradient WNL b/l. Pedal hair absent. No cyanosis or clubbing noted b/l LE.  Neurological Examination: Sensation grossly intact b/l with 10 gram monofilament. Vibratory sensation intact b/l.   Dermatological Examination: Pedal skin with normal turgor, texture and tone b/l. Toenails 1-5 b/l thick, discolored, elongated with subungual debris and pain on dorsal palpation. No hyperkeratotic lesions noted b/l.   Musculoskeletal Examination: Muscle strength 5/5 to b/l LE. HAV with bunion deformity noted b/l LE. Crossover hammertoe deformity noted bilateral 2nd toes.  Radiographs: None  Assessment:   1. Pain due to onychomycosis of toenails of both feet    Plan:  -Consent given for treatment as described below: -Examined patient. -Patient to continue soft, supportive shoe gear daily. -Toenails 1-5 b/l were debrided in length and girth with sterile nail nippers and dremel without iatrogenic bleeding.  -Patient/POA to call should there be question/concern in the interim.  No follow-ups on file.  Louann Sjogren, DPM

## 2023-02-27 NOTE — Progress Notes (Signed)
No show

## 2023-04-25 DIAGNOSIS — Z961 Presence of intraocular lens: Secondary | ICD-10-CM | POA: Diagnosis not present

## 2023-04-25 DIAGNOSIS — H16223 Keratoconjunctivitis sicca, not specified as Sjogren's, bilateral: Secondary | ICD-10-CM | POA: Diagnosis not present

## 2023-04-25 DIAGNOSIS — H401131 Primary open-angle glaucoma, bilateral, mild stage: Secondary | ICD-10-CM | POA: Diagnosis not present

## 2023-04-25 DIAGNOSIS — H26491 Other secondary cataract, right eye: Secondary | ICD-10-CM | POA: Diagnosis not present

## 2023-05-01 ENCOUNTER — Encounter: Payer: Self-pay | Admitting: Podiatry

## 2023-05-01 ENCOUNTER — Ambulatory Visit (INDEPENDENT_AMBULATORY_CARE_PROVIDER_SITE_OTHER): Payer: Medicare Other | Admitting: Podiatry

## 2023-05-01 VITALS — Ht 60.0 in | Wt 118.0 lb

## 2023-05-01 DIAGNOSIS — B351 Tinea unguium: Secondary | ICD-10-CM | POA: Diagnosis not present

## 2023-05-01 DIAGNOSIS — M79674 Pain in right toe(s): Secondary | ICD-10-CM

## 2023-05-01 DIAGNOSIS — M79675 Pain in left toe(s): Secondary | ICD-10-CM | POA: Diagnosis not present

## 2023-05-01 NOTE — Progress Notes (Signed)
  Subjective:  Patient ID: Tina Schroeder, female    DOB: 1923-04-04,  MRN: [id number]  89 y.o. female presents painful, elongated thickened toenails x 10 which are symptomatic when wearing enclosed shoe gear. This interferes with his/her daily activities. Chief Complaint  Patient presents with   RFC    She is here for nail trim, PCP is Dr. Durene Cal and she sees him once a year,.    New problem(s): None   PCP is Shelva Majestic, MD.  Allergies  Allergen Reactions   Quinine Derivatives Palpitations    Nausea, weak    Review of Systems: Negative except as noted in the HPI.   Objective:  Tina Schroeder is a pleasant 89 y.o. female WD, WN in NAD. AAO x 3.  Vascular Examination: Vascular status intact b/l with palpable pedal pulses. CFT immediate b/l. Pedal hair present. No edema. No pain with calf compression b/l. Skin temperature gradient WNL b/l. No varicosities noted. No cyanosis or clubbing noted.  Neurological Examination: Sensation grossly intact b/l with 10 gram monofilament. Vibratory sensation intact b/l.  Dermatological Examination: Pedal skin with normal turgor, texture and tone b/l. No open wounds nor interdigital macerations noted. Toenails 1-5 b/l thick, discolored, elongated with subungual debris and pain on dorsal palpation. Mild pressure spot with hyperkeratosis medial aspect left 2nd digit. Digit abuts left hallux.  Musculoskeletal Examination: Muscle strength 5/5 to b/l LE.  HAV with bunion deformity noted b/l LE. Crossover hammertoe deformity noted bilateral 2nd toes.  Radiographs: None  Last A1c:       No data to display           Assessment:   1. Pain due to onychomycosis of toenails of both feet    Plan:  Patient was evaluated and treated. All patient's and/or POA's questions/concerns addressed on today's visit. Mycotic toenails 1-5 debrided in length and girth without incident. Continue soft, supportive shoe gear daily. Report any pedal injuries to  medical professional. Call office if there are any quesitons/concerns. -Dispensed tube foam. Apply to bilateral great toes or bilateral 2nd digits  every morning. Remove every evening. -Patient/POA to call should there be question/concern in the interim.  Return in about 9 weeks (around 07/03/2023).  Freddie Breech, DPM      [place] LOCATION: [address] [place] [address]                   Office [phone]   [facility] LOCATION: [address] [place], [place] [address] Office [phone]

## 2023-07-03 ENCOUNTER — Ambulatory Visit (INDEPENDENT_AMBULATORY_CARE_PROVIDER_SITE_OTHER): Payer: Medicare Other | Admitting: Podiatry

## 2023-07-03 ENCOUNTER — Encounter: Payer: Self-pay | Admitting: Podiatry

## 2023-07-03 DIAGNOSIS — M79675 Pain in left toe(s): Secondary | ICD-10-CM | POA: Diagnosis not present

## 2023-07-03 DIAGNOSIS — B351 Tinea unguium: Secondary | ICD-10-CM

## 2023-07-03 DIAGNOSIS — M79674 Pain in right toe(s): Secondary | ICD-10-CM

## 2023-07-07 NOTE — Progress Notes (Signed)
  Subjective:  Patient ID: Tina Schroeder, female    DOB: 1924/02/06,  MRN: [id number]  89 y.o. female presents painful elongated mycotic toenails 1-5 bilaterally which are tender when wearing enclosed shoe gear. Pain is relieved with periodic professional debridement. Chief Complaint  Patient presents with   Nail Problem    Patient knows who her PCP is and her name, PCP name is [name] . Patient last seen PCP a few weeks ago    New problem(s): None   PCP is Almira Jaeger, MD .  Allergies  Allergen Reactions   Quinine Derivatives Palpitations    Nausea, weak   Review of Systems: Negative except as noted in the HPI.   Objective:  Harvest Blevens is a pleasant 89 y.o. female WD, WN in NAD. AAO x 3.  Vascular Examination: Vascular status intact b/l with palpable pedal pulses. CFT immediate b/l. Pedal hair present. No edema. No pain with calf compression b/l. Skin temperature gradient WNL b/l. No varicosities noted. No cyanosis or clubbing noted.  Neurological Examination: Sensation grossly intact b/l with 10 gram monofilament. Vibratory sensation intact b/l.  Dermatological Examination: Pedal skin with normal turgor, texture and tone b/l. No open wounds nor interdigital macerations noted. Toenails 1-5 b/l thick, discolored, elongated with subungual debris and pain on dorsal palpation. Porokeratotic lesion left 2nd toe.  Musculoskeletal Examination: Muscle strength 5/5 to b/l LE.  No pain, crepitus noted b/l. No gross pedal deformities. Patient ambulates independently without assistive aids.   Radiographs: None  Last A1c:       No data to display          Assessment:   1. Pain due to onychomycosis of toenails of both feet    Plan:  Patient was evaluated and treated. All patient's and/or POA's questions/concerns addressed on today's visit. Toenails 1-5 debrided in length and girth without incident. Continue soft, supportive shoe gear daily. Report any pedal  injuries to medical professional. Call office if there are any questions/concerns. -As a courtesy, preulcerative lesion(s) left second digit pared without complication or incident. Total number pared=1. -Dispensed tube foam. Apply to L 2nd toe every morning. Remove every evening. -Patient/POA to call should there be question/concern in the interim.  Return in about 3 months (around 10/02/2023).  [name], DPM      [place] LOCATION: [address] [place] [address]                   Office [phone]   [facility] LOCATION: [address] [place], [place] [address] Office [phone]

## 2023-07-30 ENCOUNTER — Ambulatory Visit: Payer: Medicare Other | Admitting: Podiatry

## 2023-08-18 DIAGNOSIS — Z4789 Encounter for other orthopedic aftercare: Secondary | ICD-10-CM | POA: Diagnosis not present

## 2023-08-18 DIAGNOSIS — S299XXA Unspecified injury of thorax, initial encounter: Secondary | ICD-10-CM | POA: Diagnosis not present

## 2023-08-18 DIAGNOSIS — S72001A Fracture of unspecified part of neck of right femur, initial encounter for closed fracture: Secondary | ICD-10-CM | POA: Diagnosis not present

## 2023-08-18 DIAGNOSIS — H409 Unspecified glaucoma: Secondary | ICD-10-CM | POA: Diagnosis not present

## 2023-08-18 DIAGNOSIS — K5900 Constipation, unspecified: Secondary | ICD-10-CM | POA: Diagnosis not present

## 2023-08-18 DIAGNOSIS — S72142A Displaced intertrochanteric fracture of left femur, initial encounter for closed fracture: Secondary | ICD-10-CM | POA: Diagnosis not present

## 2023-08-18 DIAGNOSIS — Z743 Need for continuous supervision: Secondary | ICD-10-CM | POA: Diagnosis not present

## 2023-08-18 DIAGNOSIS — Z66 Do not resuscitate: Secondary | ICD-10-CM | POA: Diagnosis not present

## 2023-08-18 DIAGNOSIS — W1830XA Fall on same level, unspecified, initial encounter: Secondary | ICD-10-CM | POA: Diagnosis not present

## 2023-08-18 DIAGNOSIS — S72144A Nondisplaced intertrochanteric fracture of right femur, initial encounter for closed fracture: Secondary | ICD-10-CM | POA: Diagnosis not present

## 2023-08-18 DIAGNOSIS — Y92003 Bedroom of unspecified non-institutional (private) residence as the place of occurrence of the external cause: Secondary | ICD-10-CM | POA: Diagnosis not present

## 2023-08-18 DIAGNOSIS — M810 Age-related osteoporosis without current pathological fracture: Secondary | ICD-10-CM | POA: Diagnosis not present

## 2023-08-18 DIAGNOSIS — M25551 Pain in right hip: Secondary | ICD-10-CM | POA: Diagnosis not present

## 2023-08-18 DIAGNOSIS — D500 Iron deficiency anemia secondary to blood loss (chronic): Secondary | ICD-10-CM | POA: Diagnosis not present

## 2023-08-18 DIAGNOSIS — D62 Acute posthemorrhagic anemia: Secondary | ICD-10-CM | POA: Diagnosis present

## 2023-08-18 DIAGNOSIS — M25559 Pain in unspecified hip: Secondary | ICD-10-CM | POA: Diagnosis not present

## 2023-08-18 DIAGNOSIS — Y9301 Activity, walking, marching and hiking: Secondary | ICD-10-CM | POA: Diagnosis not present

## 2023-08-18 DIAGNOSIS — W010XXA Fall on same level from slipping, tripping and stumbling without subsequent striking against object, initial encounter: Secondary | ICD-10-CM | POA: Diagnosis not present

## 2023-08-18 DIAGNOSIS — S72141A Displaced intertrochanteric fracture of right femur, initial encounter for closed fracture: Secondary | ICD-10-CM | POA: Diagnosis not present

## 2023-08-19 DIAGNOSIS — S72001A Fracture of unspecified part of neck of right femur, initial encounter for closed fracture: Secondary | ICD-10-CM | POA: Diagnosis not present

## 2023-08-22 DIAGNOSIS — R2689 Other abnormalities of gait and mobility: Secondary | ICD-10-CM | POA: Diagnosis not present

## 2023-08-22 DIAGNOSIS — R413 Other amnesia: Secondary | ICD-10-CM | POA: Diagnosis not present

## 2023-08-22 DIAGNOSIS — G8918 Other acute postprocedural pain: Secondary | ICD-10-CM | POA: Diagnosis not present

## 2023-08-22 DIAGNOSIS — R197 Diarrhea, unspecified: Secondary | ICD-10-CM | POA: Diagnosis not present

## 2023-08-22 DIAGNOSIS — L89153 Pressure ulcer of sacral region, stage 3: Secondary | ICD-10-CM | POA: Diagnosis not present

## 2023-08-22 DIAGNOSIS — M25551 Pain in right hip: Secondary | ICD-10-CM | POA: Diagnosis not present

## 2023-08-22 DIAGNOSIS — D500 Iron deficiency anemia secondary to blood loss (chronic): Secondary | ICD-10-CM | POA: Diagnosis not present

## 2023-08-22 DIAGNOSIS — S72351D Displaced comminuted fracture of shaft of right femur, subsequent encounter for closed fracture with routine healing: Secondary | ICD-10-CM | POA: Diagnosis not present

## 2023-08-22 DIAGNOSIS — H04123 Dry eye syndrome of bilateral lacrimal glands: Secondary | ICD-10-CM | POA: Diagnosis not present

## 2023-08-22 DIAGNOSIS — I10 Essential (primary) hypertension: Secondary | ICD-10-CM | POA: Diagnosis not present

## 2023-08-22 DIAGNOSIS — S72351A Displaced comminuted fracture of shaft of right femur, initial encounter for closed fracture: Secondary | ICD-10-CM | POA: Diagnosis not present

## 2023-08-22 DIAGNOSIS — H409 Unspecified glaucoma: Secondary | ICD-10-CM | POA: Diagnosis not present

## 2023-08-22 DIAGNOSIS — S72141A Displaced intertrochanteric fracture of right femur, initial encounter for closed fracture: Secondary | ICD-10-CM | POA: Diagnosis not present

## 2023-08-22 DIAGNOSIS — R296 Repeated falls: Secondary | ICD-10-CM | POA: Diagnosis not present

## 2023-08-22 DIAGNOSIS — D649 Anemia, unspecified: Secondary | ICD-10-CM | POA: Diagnosis not present

## 2023-08-22 DIAGNOSIS — Z789 Other specified health status: Secondary | ICD-10-CM | POA: Diagnosis not present

## 2023-08-22 DIAGNOSIS — S72141D Displaced intertrochanteric fracture of right femur, subsequent encounter for closed fracture with routine healing: Secondary | ICD-10-CM | POA: Diagnosis not present

## 2023-08-22 DIAGNOSIS — R269 Unspecified abnormalities of gait and mobility: Secondary | ICD-10-CM | POA: Diagnosis not present

## 2023-08-22 DIAGNOSIS — R531 Weakness: Secondary | ICD-10-CM | POA: Diagnosis not present

## 2023-08-22 DIAGNOSIS — R279 Unspecified lack of coordination: Secondary | ICD-10-CM | POA: Diagnosis not present

## 2023-08-22 DIAGNOSIS — M6281 Muscle weakness (generalized): Secondary | ICD-10-CM | POA: Diagnosis not present

## 2023-08-22 DIAGNOSIS — S72009A Fracture of unspecified part of neck of unspecified femur, initial encounter for closed fracture: Secondary | ICD-10-CM | POA: Diagnosis not present

## 2023-08-22 DIAGNOSIS — W19XXXA Unspecified fall, initial encounter: Secondary | ICD-10-CM | POA: Diagnosis not present

## 2023-08-22 DIAGNOSIS — S7291XA Unspecified fracture of right femur, initial encounter for closed fracture: Secondary | ICD-10-CM | POA: Diagnosis not present

## 2023-08-22 DIAGNOSIS — S61511A Laceration without foreign body of right wrist, initial encounter: Secondary | ICD-10-CM | POA: Diagnosis not present

## 2023-08-22 DIAGNOSIS — M810 Age-related osteoporosis without current pathological fracture: Secondary | ICD-10-CM | POA: Diagnosis not present

## 2023-08-22 DIAGNOSIS — S7291XD Unspecified fracture of right femur, subsequent encounter for closed fracture with routine healing: Secondary | ICD-10-CM | POA: Diagnosis not present

## 2023-08-22 DIAGNOSIS — E871 Hypo-osmolality and hyponatremia: Secondary | ICD-10-CM | POA: Diagnosis not present

## 2023-08-30 DIAGNOSIS — S72141D Displaced intertrochanteric fracture of right femur, subsequent encounter for closed fracture with routine healing: Secondary | ICD-10-CM | POA: Diagnosis not present

## 2023-09-07 DIAGNOSIS — R197 Diarrhea, unspecified: Secondary | ICD-10-CM | POA: Diagnosis not present

## 2023-09-07 DIAGNOSIS — G8918 Other acute postprocedural pain: Secondary | ICD-10-CM | POA: Diagnosis not present

## 2023-09-07 DIAGNOSIS — Z79891 Long term (current) use of opiate analgesic: Secondary | ICD-10-CM | POA: Diagnosis not present

## 2023-09-07 DIAGNOSIS — D62 Acute posthemorrhagic anemia: Secondary | ICD-10-CM | POA: Diagnosis not present

## 2023-09-07 DIAGNOSIS — H409 Unspecified glaucoma: Secondary | ICD-10-CM | POA: Diagnosis not present

## 2023-09-07 DIAGNOSIS — Z7901 Long term (current) use of anticoagulants: Secondary | ICD-10-CM | POA: Diagnosis not present

## 2023-09-07 DIAGNOSIS — Z9181 History of falling: Secondary | ICD-10-CM | POA: Diagnosis not present

## 2023-09-07 DIAGNOSIS — R296 Repeated falls: Secondary | ICD-10-CM | POA: Diagnosis not present

## 2023-09-07 DIAGNOSIS — R413 Other amnesia: Secondary | ICD-10-CM | POA: Diagnosis not present

## 2023-09-07 DIAGNOSIS — L89153 Pressure ulcer of sacral region, stage 3: Secondary | ICD-10-CM | POA: Diagnosis not present

## 2023-09-07 DIAGNOSIS — M6281 Muscle weakness (generalized): Secondary | ICD-10-CM | POA: Diagnosis not present

## 2023-09-07 DIAGNOSIS — M810 Age-related osteoporosis without current pathological fracture: Secondary | ICD-10-CM | POA: Diagnosis not present

## 2023-09-07 DIAGNOSIS — S72141D Displaced intertrochanteric fracture of right femur, subsequent encounter for closed fracture with routine healing: Secondary | ICD-10-CM | POA: Diagnosis not present

## 2023-09-08 DIAGNOSIS — M810 Age-related osteoporosis without current pathological fracture: Secondary | ICD-10-CM | POA: Diagnosis not present

## 2023-09-08 DIAGNOSIS — G8918 Other acute postprocedural pain: Secondary | ICD-10-CM | POA: Diagnosis not present

## 2023-09-08 DIAGNOSIS — D62 Acute posthemorrhagic anemia: Secondary | ICD-10-CM | POA: Diagnosis not present

## 2023-09-08 DIAGNOSIS — L89153 Pressure ulcer of sacral region, stage 3: Secondary | ICD-10-CM | POA: Diagnosis not present

## 2023-09-08 DIAGNOSIS — S72141D Displaced intertrochanteric fracture of right femur, subsequent encounter for closed fracture with routine healing: Secondary | ICD-10-CM | POA: Diagnosis not present

## 2023-09-08 DIAGNOSIS — M6281 Muscle weakness (generalized): Secondary | ICD-10-CM | POA: Diagnosis not present

## 2023-09-09 DIAGNOSIS — M810 Age-related osteoporosis without current pathological fracture: Secondary | ICD-10-CM | POA: Diagnosis not present

## 2023-09-09 DIAGNOSIS — G8918 Other acute postprocedural pain: Secondary | ICD-10-CM | POA: Diagnosis not present

## 2023-09-09 DIAGNOSIS — S72141D Displaced intertrochanteric fracture of right femur, subsequent encounter for closed fracture with routine healing: Secondary | ICD-10-CM | POA: Diagnosis not present

## 2023-09-09 DIAGNOSIS — L89153 Pressure ulcer of sacral region, stage 3: Secondary | ICD-10-CM | POA: Diagnosis not present

## 2023-09-09 DIAGNOSIS — M6281 Muscle weakness (generalized): Secondary | ICD-10-CM | POA: Diagnosis not present

## 2023-09-09 DIAGNOSIS — D62 Acute posthemorrhagic anemia: Secondary | ICD-10-CM | POA: Diagnosis not present

## 2023-09-10 DIAGNOSIS — G8918 Other acute postprocedural pain: Secondary | ICD-10-CM | POA: Diagnosis not present

## 2023-09-10 DIAGNOSIS — M810 Age-related osteoporosis without current pathological fracture: Secondary | ICD-10-CM | POA: Diagnosis not present

## 2023-09-10 DIAGNOSIS — S72141D Displaced intertrochanteric fracture of right femur, subsequent encounter for closed fracture with routine healing: Secondary | ICD-10-CM | POA: Diagnosis not present

## 2023-09-10 DIAGNOSIS — M6281 Muscle weakness (generalized): Secondary | ICD-10-CM | POA: Diagnosis not present

## 2023-09-10 DIAGNOSIS — L89153 Pressure ulcer of sacral region, stage 3: Secondary | ICD-10-CM | POA: Diagnosis not present

## 2023-09-10 DIAGNOSIS — D62 Acute posthemorrhagic anemia: Secondary | ICD-10-CM | POA: Diagnosis not present

## 2023-09-11 DIAGNOSIS — M810 Age-related osteoporosis without current pathological fracture: Secondary | ICD-10-CM | POA: Diagnosis not present

## 2023-09-11 DIAGNOSIS — R296 Repeated falls: Secondary | ICD-10-CM | POA: Diagnosis not present

## 2023-09-11 DIAGNOSIS — G8918 Other acute postprocedural pain: Secondary | ICD-10-CM | POA: Diagnosis not present

## 2023-09-11 DIAGNOSIS — D649 Anemia, unspecified: Secondary | ICD-10-CM | POA: Diagnosis not present

## 2023-09-11 DIAGNOSIS — D62 Acute posthemorrhagic anemia: Secondary | ICD-10-CM | POA: Diagnosis not present

## 2023-09-11 DIAGNOSIS — M6281 Muscle weakness (generalized): Secondary | ICD-10-CM | POA: Diagnosis not present

## 2023-09-11 DIAGNOSIS — S72141D Displaced intertrochanteric fracture of right femur, subsequent encounter for closed fracture with routine healing: Secondary | ICD-10-CM | POA: Diagnosis not present

## 2023-09-11 DIAGNOSIS — L89153 Pressure ulcer of sacral region, stage 3: Secondary | ICD-10-CM | POA: Diagnosis not present

## 2023-09-12 DIAGNOSIS — G8918 Other acute postprocedural pain: Secondary | ICD-10-CM | POA: Diagnosis not present

## 2023-09-12 DIAGNOSIS — L89153 Pressure ulcer of sacral region, stage 3: Secondary | ICD-10-CM | POA: Diagnosis not present

## 2023-09-12 DIAGNOSIS — D62 Acute posthemorrhagic anemia: Secondary | ICD-10-CM | POA: Diagnosis not present

## 2023-09-12 DIAGNOSIS — S72141D Displaced intertrochanteric fracture of right femur, subsequent encounter for closed fracture with routine healing: Secondary | ICD-10-CM | POA: Diagnosis not present

## 2023-09-12 DIAGNOSIS — M810 Age-related osteoporosis without current pathological fracture: Secondary | ICD-10-CM | POA: Diagnosis not present

## 2023-09-12 DIAGNOSIS — M6281 Muscle weakness (generalized): Secondary | ICD-10-CM | POA: Diagnosis not present

## 2023-09-13 DIAGNOSIS — L89153 Pressure ulcer of sacral region, stage 3: Secondary | ICD-10-CM | POA: Diagnosis not present

## 2023-09-13 DIAGNOSIS — S72141D Displaced intertrochanteric fracture of right femur, subsequent encounter for closed fracture with routine healing: Secondary | ICD-10-CM | POA: Diagnosis not present

## 2023-09-13 DIAGNOSIS — M6281 Muscle weakness (generalized): Secondary | ICD-10-CM | POA: Diagnosis not present

## 2023-09-13 DIAGNOSIS — M810 Age-related osteoporosis without current pathological fracture: Secondary | ICD-10-CM | POA: Diagnosis not present

## 2023-09-13 DIAGNOSIS — D62 Acute posthemorrhagic anemia: Secondary | ICD-10-CM | POA: Diagnosis not present

## 2023-09-13 DIAGNOSIS — G8918 Other acute postprocedural pain: Secondary | ICD-10-CM | POA: Diagnosis not present

## 2023-09-14 DIAGNOSIS — M810 Age-related osteoporosis without current pathological fracture: Secondary | ICD-10-CM | POA: Diagnosis not present

## 2023-09-14 DIAGNOSIS — S72141D Displaced intertrochanteric fracture of right femur, subsequent encounter for closed fracture with routine healing: Secondary | ICD-10-CM | POA: Diagnosis not present

## 2023-09-14 DIAGNOSIS — M6281 Muscle weakness (generalized): Secondary | ICD-10-CM | POA: Diagnosis not present

## 2023-09-14 DIAGNOSIS — L89153 Pressure ulcer of sacral region, stage 3: Secondary | ICD-10-CM | POA: Diagnosis not present

## 2023-09-14 DIAGNOSIS — G8918 Other acute postprocedural pain: Secondary | ICD-10-CM | POA: Diagnosis not present

## 2023-09-14 DIAGNOSIS — D62 Acute posthemorrhagic anemia: Secondary | ICD-10-CM | POA: Diagnosis not present

## 2023-09-15 DIAGNOSIS — M810 Age-related osteoporosis without current pathological fracture: Secondary | ICD-10-CM | POA: Diagnosis not present

## 2023-09-15 DIAGNOSIS — S72141D Displaced intertrochanteric fracture of right femur, subsequent encounter for closed fracture with routine healing: Secondary | ICD-10-CM | POA: Diagnosis not present

## 2023-09-15 DIAGNOSIS — D62 Acute posthemorrhagic anemia: Secondary | ICD-10-CM | POA: Diagnosis not present

## 2023-09-15 DIAGNOSIS — M6281 Muscle weakness (generalized): Secondary | ICD-10-CM | POA: Diagnosis not present

## 2023-09-15 DIAGNOSIS — G8918 Other acute postprocedural pain: Secondary | ICD-10-CM | POA: Diagnosis not present

## 2023-09-15 DIAGNOSIS — L89153 Pressure ulcer of sacral region, stage 3: Secondary | ICD-10-CM | POA: Diagnosis not present

## 2023-09-16 DIAGNOSIS — M810 Age-related osteoporosis without current pathological fracture: Secondary | ICD-10-CM | POA: Diagnosis not present

## 2023-09-16 DIAGNOSIS — M6281 Muscle weakness (generalized): Secondary | ICD-10-CM | POA: Diagnosis not present

## 2023-09-16 DIAGNOSIS — D62 Acute posthemorrhagic anemia: Secondary | ICD-10-CM | POA: Diagnosis not present

## 2023-09-16 DIAGNOSIS — S72141D Displaced intertrochanteric fracture of right femur, subsequent encounter for closed fracture with routine healing: Secondary | ICD-10-CM | POA: Diagnosis not present

## 2023-09-16 DIAGNOSIS — L89153 Pressure ulcer of sacral region, stage 3: Secondary | ICD-10-CM | POA: Diagnosis not present

## 2023-09-16 DIAGNOSIS — G8918 Other acute postprocedural pain: Secondary | ICD-10-CM | POA: Diagnosis not present

## 2023-09-17 DIAGNOSIS — D62 Acute posthemorrhagic anemia: Secondary | ICD-10-CM | POA: Diagnosis not present

## 2023-09-17 DIAGNOSIS — G8918 Other acute postprocedural pain: Secondary | ICD-10-CM | POA: Diagnosis not present

## 2023-09-17 DIAGNOSIS — M6281 Muscle weakness (generalized): Secondary | ICD-10-CM | POA: Diagnosis not present

## 2023-09-17 DIAGNOSIS — M810 Age-related osteoporosis without current pathological fracture: Secondary | ICD-10-CM | POA: Diagnosis not present

## 2023-09-17 DIAGNOSIS — L89153 Pressure ulcer of sacral region, stage 3: Secondary | ICD-10-CM | POA: Diagnosis not present

## 2023-09-17 DIAGNOSIS — S72141D Displaced intertrochanteric fracture of right femur, subsequent encounter for closed fracture with routine healing: Secondary | ICD-10-CM | POA: Diagnosis not present

## 2023-09-18 DIAGNOSIS — G8918 Other acute postprocedural pain: Secondary | ICD-10-CM | POA: Diagnosis not present

## 2023-09-18 DIAGNOSIS — D62 Acute posthemorrhagic anemia: Secondary | ICD-10-CM | POA: Diagnosis not present

## 2023-09-18 DIAGNOSIS — M6281 Muscle weakness (generalized): Secondary | ICD-10-CM | POA: Diagnosis not present

## 2023-09-18 DIAGNOSIS — L89153 Pressure ulcer of sacral region, stage 3: Secondary | ICD-10-CM | POA: Diagnosis not present

## 2023-09-18 DIAGNOSIS — M810 Age-related osteoporosis without current pathological fracture: Secondary | ICD-10-CM | POA: Diagnosis not present

## 2023-09-18 DIAGNOSIS — S72141D Displaced intertrochanteric fracture of right femur, subsequent encounter for closed fracture with routine healing: Secondary | ICD-10-CM | POA: Diagnosis not present

## 2023-09-19 DIAGNOSIS — S72141D Displaced intertrochanteric fracture of right femur, subsequent encounter for closed fracture with routine healing: Secondary | ICD-10-CM | POA: Diagnosis not present

## 2023-09-19 DIAGNOSIS — M810 Age-related osteoporosis without current pathological fracture: Secondary | ICD-10-CM | POA: Diagnosis not present

## 2023-09-19 DIAGNOSIS — L89153 Pressure ulcer of sacral region, stage 3: Secondary | ICD-10-CM | POA: Diagnosis not present

## 2023-09-19 DIAGNOSIS — E559 Vitamin D deficiency, unspecified: Secondary | ICD-10-CM | POA: Diagnosis not present

## 2023-09-19 DIAGNOSIS — D62 Acute posthemorrhagic anemia: Secondary | ICD-10-CM | POA: Diagnosis not present

## 2023-09-19 DIAGNOSIS — M6281 Muscle weakness (generalized): Secondary | ICD-10-CM | POA: Diagnosis not present

## 2023-09-19 DIAGNOSIS — G8918 Other acute postprocedural pain: Secondary | ICD-10-CM | POA: Diagnosis not present

## 2023-09-19 DIAGNOSIS — R946 Abnormal results of thyroid function studies: Secondary | ICD-10-CM | POA: Diagnosis not present

## 2023-09-19 DIAGNOSIS — R799 Abnormal finding of blood chemistry, unspecified: Secondary | ICD-10-CM | POA: Diagnosis not present

## 2023-09-20 ENCOUNTER — Ambulatory Visit: Admitting: Podiatry

## 2023-09-20 DIAGNOSIS — S72141D Displaced intertrochanteric fracture of right femur, subsequent encounter for closed fracture with routine healing: Secondary | ICD-10-CM | POA: Diagnosis not present

## 2023-09-20 DIAGNOSIS — M6281 Muscle weakness (generalized): Secondary | ICD-10-CM | POA: Diagnosis not present

## 2023-09-20 DIAGNOSIS — G8918 Other acute postprocedural pain: Secondary | ICD-10-CM | POA: Diagnosis not present

## 2023-09-20 DIAGNOSIS — D62 Acute posthemorrhagic anemia: Secondary | ICD-10-CM | POA: Diagnosis not present

## 2023-09-20 DIAGNOSIS — M810 Age-related osteoporosis without current pathological fracture: Secondary | ICD-10-CM | POA: Diagnosis not present

## 2023-09-20 DIAGNOSIS — L89153 Pressure ulcer of sacral region, stage 3: Secondary | ICD-10-CM | POA: Diagnosis not present

## 2023-09-21 DIAGNOSIS — S72141D Displaced intertrochanteric fracture of right femur, subsequent encounter for closed fracture with routine healing: Secondary | ICD-10-CM | POA: Diagnosis not present

## 2023-09-21 DIAGNOSIS — D62 Acute posthemorrhagic anemia: Secondary | ICD-10-CM | POA: Diagnosis not present

## 2023-09-21 DIAGNOSIS — L89153 Pressure ulcer of sacral region, stage 3: Secondary | ICD-10-CM | POA: Diagnosis not present

## 2023-09-21 DIAGNOSIS — M810 Age-related osteoporosis without current pathological fracture: Secondary | ICD-10-CM | POA: Diagnosis not present

## 2023-09-21 DIAGNOSIS — G8918 Other acute postprocedural pain: Secondary | ICD-10-CM | POA: Diagnosis not present

## 2023-09-21 DIAGNOSIS — M6281 Muscle weakness (generalized): Secondary | ICD-10-CM | POA: Diagnosis not present

## 2023-09-23 DIAGNOSIS — G8918 Other acute postprocedural pain: Secondary | ICD-10-CM | POA: Diagnosis not present

## 2023-09-23 DIAGNOSIS — S72141D Displaced intertrochanteric fracture of right femur, subsequent encounter for closed fracture with routine healing: Secondary | ICD-10-CM | POA: Diagnosis not present

## 2023-09-23 DIAGNOSIS — M6281 Muscle weakness (generalized): Secondary | ICD-10-CM | POA: Diagnosis not present

## 2023-09-23 DIAGNOSIS — N390 Urinary tract infection, site not specified: Secondary | ICD-10-CM | POA: Diagnosis not present

## 2023-09-23 DIAGNOSIS — M810 Age-related osteoporosis without current pathological fracture: Secondary | ICD-10-CM | POA: Diagnosis not present

## 2023-09-23 DIAGNOSIS — R799 Abnormal finding of blood chemistry, unspecified: Secondary | ICD-10-CM | POA: Diagnosis not present

## 2023-09-23 DIAGNOSIS — L89153 Pressure ulcer of sacral region, stage 3: Secondary | ICD-10-CM | POA: Diagnosis not present

## 2023-09-23 DIAGNOSIS — D62 Acute posthemorrhagic anemia: Secondary | ICD-10-CM | POA: Diagnosis not present

## 2023-09-25 DIAGNOSIS — M6281 Muscle weakness (generalized): Secondary | ICD-10-CM | POA: Diagnosis not present

## 2023-09-25 DIAGNOSIS — M810 Age-related osteoporosis without current pathological fracture: Secondary | ICD-10-CM | POA: Diagnosis not present

## 2023-09-25 DIAGNOSIS — D62 Acute posthemorrhagic anemia: Secondary | ICD-10-CM | POA: Diagnosis not present

## 2023-09-25 DIAGNOSIS — G8918 Other acute postprocedural pain: Secondary | ICD-10-CM | POA: Diagnosis not present

## 2023-09-25 DIAGNOSIS — L89153 Pressure ulcer of sacral region, stage 3: Secondary | ICD-10-CM | POA: Diagnosis not present

## 2023-09-25 DIAGNOSIS — S72141D Displaced intertrochanteric fracture of right femur, subsequent encounter for closed fracture with routine healing: Secondary | ICD-10-CM | POA: Diagnosis not present

## 2023-09-26 DIAGNOSIS — L89153 Pressure ulcer of sacral region, stage 3: Secondary | ICD-10-CM | POA: Diagnosis not present

## 2023-09-26 DIAGNOSIS — M6281 Muscle weakness (generalized): Secondary | ICD-10-CM | POA: Diagnosis not present

## 2023-09-26 DIAGNOSIS — D62 Acute posthemorrhagic anemia: Secondary | ICD-10-CM | POA: Diagnosis not present

## 2023-09-26 DIAGNOSIS — M810 Age-related osteoporosis without current pathological fracture: Secondary | ICD-10-CM | POA: Diagnosis not present

## 2023-09-26 DIAGNOSIS — G8918 Other acute postprocedural pain: Secondary | ICD-10-CM | POA: Diagnosis not present

## 2023-09-26 DIAGNOSIS — S72141D Displaced intertrochanteric fracture of right femur, subsequent encounter for closed fracture with routine healing: Secondary | ICD-10-CM | POA: Diagnosis not present

## 2023-09-27 DIAGNOSIS — D62 Acute posthemorrhagic anemia: Secondary | ICD-10-CM | POA: Diagnosis not present

## 2023-09-27 DIAGNOSIS — G8918 Other acute postprocedural pain: Secondary | ICD-10-CM | POA: Diagnosis not present

## 2023-09-27 DIAGNOSIS — M810 Age-related osteoporosis without current pathological fracture: Secondary | ICD-10-CM | POA: Diagnosis not present

## 2023-09-27 DIAGNOSIS — S72141D Displaced intertrochanteric fracture of right femur, subsequent encounter for closed fracture with routine healing: Secondary | ICD-10-CM | POA: Diagnosis not present

## 2023-09-27 DIAGNOSIS — L89153 Pressure ulcer of sacral region, stage 3: Secondary | ICD-10-CM | POA: Diagnosis not present

## 2023-09-27 DIAGNOSIS — M6281 Muscle weakness (generalized): Secondary | ICD-10-CM | POA: Diagnosis not present

## 2023-09-28 DIAGNOSIS — M810 Age-related osteoporosis without current pathological fracture: Secondary | ICD-10-CM | POA: Diagnosis not present

## 2023-09-28 DIAGNOSIS — L89153 Pressure ulcer of sacral region, stage 3: Secondary | ICD-10-CM | POA: Diagnosis not present

## 2023-09-28 DIAGNOSIS — M6281 Muscle weakness (generalized): Secondary | ICD-10-CM | POA: Diagnosis not present

## 2023-09-28 DIAGNOSIS — G8918 Other acute postprocedural pain: Secondary | ICD-10-CM | POA: Diagnosis not present

## 2023-09-28 DIAGNOSIS — S72141D Displaced intertrochanteric fracture of right femur, subsequent encounter for closed fracture with routine healing: Secondary | ICD-10-CM | POA: Diagnosis not present

## 2023-09-28 DIAGNOSIS — D62 Acute posthemorrhagic anemia: Secondary | ICD-10-CM | POA: Diagnosis not present

## 2023-09-30 DIAGNOSIS — M810 Age-related osteoporosis without current pathological fracture: Secondary | ICD-10-CM | POA: Diagnosis not present

## 2023-09-30 DIAGNOSIS — M6281 Muscle weakness (generalized): Secondary | ICD-10-CM | POA: Diagnosis not present

## 2023-09-30 DIAGNOSIS — L89153 Pressure ulcer of sacral region, stage 3: Secondary | ICD-10-CM | POA: Diagnosis not present

## 2023-09-30 DIAGNOSIS — G8918 Other acute postprocedural pain: Secondary | ICD-10-CM | POA: Diagnosis not present

## 2023-09-30 DIAGNOSIS — S72141D Displaced intertrochanteric fracture of right femur, subsequent encounter for closed fracture with routine healing: Secondary | ICD-10-CM | POA: Diagnosis not present

## 2023-09-30 DIAGNOSIS — D62 Acute posthemorrhagic anemia: Secondary | ICD-10-CM | POA: Diagnosis not present

## 2023-10-01 DIAGNOSIS — L89153 Pressure ulcer of sacral region, stage 3: Secondary | ICD-10-CM | POA: Diagnosis not present

## 2023-10-01 DIAGNOSIS — G8918 Other acute postprocedural pain: Secondary | ICD-10-CM | POA: Diagnosis not present

## 2023-10-01 DIAGNOSIS — M810 Age-related osteoporosis without current pathological fracture: Secondary | ICD-10-CM | POA: Diagnosis not present

## 2023-10-01 DIAGNOSIS — M6281 Muscle weakness (generalized): Secondary | ICD-10-CM | POA: Diagnosis not present

## 2023-10-01 DIAGNOSIS — S72141D Displaced intertrochanteric fracture of right femur, subsequent encounter for closed fracture with routine healing: Secondary | ICD-10-CM | POA: Diagnosis not present

## 2023-10-01 DIAGNOSIS — D62 Acute posthemorrhagic anemia: Secondary | ICD-10-CM | POA: Diagnosis not present

## 2023-10-02 DIAGNOSIS — G8918 Other acute postprocedural pain: Secondary | ICD-10-CM | POA: Diagnosis not present

## 2023-10-02 DIAGNOSIS — M6281 Muscle weakness (generalized): Secondary | ICD-10-CM | POA: Diagnosis not present

## 2023-10-02 DIAGNOSIS — S72141D Displaced intertrochanteric fracture of right femur, subsequent encounter for closed fracture with routine healing: Secondary | ICD-10-CM | POA: Diagnosis not present

## 2023-10-02 DIAGNOSIS — M810 Age-related osteoporosis without current pathological fracture: Secondary | ICD-10-CM | POA: Diagnosis not present

## 2023-10-02 DIAGNOSIS — D62 Acute posthemorrhagic anemia: Secondary | ICD-10-CM | POA: Diagnosis not present

## 2023-10-02 DIAGNOSIS — L89153 Pressure ulcer of sacral region, stage 3: Secondary | ICD-10-CM | POA: Diagnosis not present

## 2023-10-03 DIAGNOSIS — D62 Acute posthemorrhagic anemia: Secondary | ICD-10-CM | POA: Diagnosis not present

## 2023-10-03 DIAGNOSIS — L89153 Pressure ulcer of sacral region, stage 3: Secondary | ICD-10-CM | POA: Diagnosis not present

## 2023-10-03 DIAGNOSIS — S72141D Displaced intertrochanteric fracture of right femur, subsequent encounter for closed fracture with routine healing: Secondary | ICD-10-CM | POA: Diagnosis not present

## 2023-10-03 DIAGNOSIS — M6281 Muscle weakness (generalized): Secondary | ICD-10-CM | POA: Diagnosis not present

## 2023-10-03 DIAGNOSIS — G8918 Other acute postprocedural pain: Secondary | ICD-10-CM | POA: Diagnosis not present

## 2023-10-03 DIAGNOSIS — M810 Age-related osteoporosis without current pathological fracture: Secondary | ICD-10-CM | POA: Diagnosis not present

## 2023-10-04 DIAGNOSIS — M6281 Muscle weakness (generalized): Secondary | ICD-10-CM | POA: Diagnosis not present

## 2023-10-04 DIAGNOSIS — S72141D Displaced intertrochanteric fracture of right femur, subsequent encounter for closed fracture with routine healing: Secondary | ICD-10-CM | POA: Diagnosis not present

## 2023-10-04 DIAGNOSIS — D62 Acute posthemorrhagic anemia: Secondary | ICD-10-CM | POA: Diagnosis not present

## 2023-10-04 DIAGNOSIS — M810 Age-related osteoporosis without current pathological fracture: Secondary | ICD-10-CM | POA: Diagnosis not present

## 2023-10-04 DIAGNOSIS — G8918 Other acute postprocedural pain: Secondary | ICD-10-CM | POA: Diagnosis not present

## 2023-10-04 DIAGNOSIS — L89153 Pressure ulcer of sacral region, stage 3: Secondary | ICD-10-CM | POA: Diagnosis not present

## 2023-10-07 DIAGNOSIS — Z7901 Long term (current) use of anticoagulants: Secondary | ICD-10-CM | POA: Diagnosis not present

## 2023-10-07 DIAGNOSIS — S72141D Displaced intertrochanteric fracture of right femur, subsequent encounter for closed fracture with routine healing: Secondary | ICD-10-CM | POA: Diagnosis not present

## 2023-10-07 DIAGNOSIS — G8918 Other acute postprocedural pain: Secondary | ICD-10-CM | POA: Diagnosis not present

## 2023-10-07 DIAGNOSIS — M6281 Muscle weakness (generalized): Secondary | ICD-10-CM | POA: Diagnosis not present

## 2023-10-07 DIAGNOSIS — D62 Acute posthemorrhagic anemia: Secondary | ICD-10-CM | POA: Diagnosis not present

## 2023-10-07 DIAGNOSIS — R197 Diarrhea, unspecified: Secondary | ICD-10-CM | POA: Diagnosis not present

## 2023-10-07 DIAGNOSIS — L89153 Pressure ulcer of sacral region, stage 3: Secondary | ICD-10-CM | POA: Diagnosis not present

## 2023-10-07 DIAGNOSIS — Z9181 History of falling: Secondary | ICD-10-CM | POA: Diagnosis not present

## 2023-10-07 DIAGNOSIS — R296 Repeated falls: Secondary | ICD-10-CM | POA: Diagnosis not present

## 2023-10-07 DIAGNOSIS — H409 Unspecified glaucoma: Secondary | ICD-10-CM | POA: Diagnosis not present

## 2023-10-07 DIAGNOSIS — Z79891 Long term (current) use of opiate analgesic: Secondary | ICD-10-CM | POA: Diagnosis not present

## 2023-10-07 DIAGNOSIS — R413 Other amnesia: Secondary | ICD-10-CM | POA: Diagnosis not present

## 2023-10-07 DIAGNOSIS — M810 Age-related osteoporosis without current pathological fracture: Secondary | ICD-10-CM | POA: Diagnosis not present

## 2023-10-08 DIAGNOSIS — G8918 Other acute postprocedural pain: Secondary | ICD-10-CM | POA: Diagnosis not present

## 2023-10-08 DIAGNOSIS — M810 Age-related osteoporosis without current pathological fracture: Secondary | ICD-10-CM | POA: Diagnosis not present

## 2023-10-08 DIAGNOSIS — M6281 Muscle weakness (generalized): Secondary | ICD-10-CM | POA: Diagnosis not present

## 2023-10-08 DIAGNOSIS — D62 Acute posthemorrhagic anemia: Secondary | ICD-10-CM | POA: Diagnosis not present

## 2023-10-08 DIAGNOSIS — S72141D Displaced intertrochanteric fracture of right femur, subsequent encounter for closed fracture with routine healing: Secondary | ICD-10-CM | POA: Diagnosis not present

## 2023-10-08 DIAGNOSIS — L89153 Pressure ulcer of sacral region, stage 3: Secondary | ICD-10-CM | POA: Diagnosis not present

## 2023-10-09 DIAGNOSIS — D62 Acute posthemorrhagic anemia: Secondary | ICD-10-CM | POA: Diagnosis not present

## 2023-10-09 DIAGNOSIS — M6281 Muscle weakness (generalized): Secondary | ICD-10-CM | POA: Diagnosis not present

## 2023-10-09 DIAGNOSIS — L89153 Pressure ulcer of sacral region, stage 3: Secondary | ICD-10-CM | POA: Diagnosis not present

## 2023-10-09 DIAGNOSIS — G8918 Other acute postprocedural pain: Secondary | ICD-10-CM | POA: Diagnosis not present

## 2023-10-09 DIAGNOSIS — S72141D Displaced intertrochanteric fracture of right femur, subsequent encounter for closed fracture with routine healing: Secondary | ICD-10-CM | POA: Diagnosis not present

## 2023-10-09 DIAGNOSIS — M810 Age-related osteoporosis without current pathological fracture: Secondary | ICD-10-CM | POA: Diagnosis not present

## 2023-10-10 DIAGNOSIS — L89153 Pressure ulcer of sacral region, stage 3: Secondary | ICD-10-CM | POA: Diagnosis not present

## 2023-10-10 DIAGNOSIS — S72141D Displaced intertrochanteric fracture of right femur, subsequent encounter for closed fracture with routine healing: Secondary | ICD-10-CM | POA: Diagnosis not present

## 2023-10-10 DIAGNOSIS — M810 Age-related osteoporosis without current pathological fracture: Secondary | ICD-10-CM | POA: Diagnosis not present

## 2023-10-10 DIAGNOSIS — D62 Acute posthemorrhagic anemia: Secondary | ICD-10-CM | POA: Diagnosis not present

## 2023-10-10 DIAGNOSIS — G8918 Other acute postprocedural pain: Secondary | ICD-10-CM | POA: Diagnosis not present

## 2023-10-10 DIAGNOSIS — M6281 Muscle weakness (generalized): Secondary | ICD-10-CM | POA: Diagnosis not present

## 2023-10-11 DIAGNOSIS — L89153 Pressure ulcer of sacral region, stage 3: Secondary | ICD-10-CM | POA: Diagnosis not present

## 2023-10-11 DIAGNOSIS — D62 Acute posthemorrhagic anemia: Secondary | ICD-10-CM | POA: Diagnosis not present

## 2023-10-11 DIAGNOSIS — M810 Age-related osteoporosis without current pathological fracture: Secondary | ICD-10-CM | POA: Diagnosis not present

## 2023-10-11 DIAGNOSIS — S72141D Displaced intertrochanteric fracture of right femur, subsequent encounter for closed fracture with routine healing: Secondary | ICD-10-CM | POA: Diagnosis not present

## 2023-10-11 DIAGNOSIS — G8918 Other acute postprocedural pain: Secondary | ICD-10-CM | POA: Diagnosis not present

## 2023-10-11 DIAGNOSIS — M6281 Muscle weakness (generalized): Secondary | ICD-10-CM | POA: Diagnosis not present

## 2023-10-14 DIAGNOSIS — S72141D Displaced intertrochanteric fracture of right femur, subsequent encounter for closed fracture with routine healing: Secondary | ICD-10-CM | POA: Diagnosis not present

## 2023-10-14 DIAGNOSIS — M810 Age-related osteoporosis without current pathological fracture: Secondary | ICD-10-CM | POA: Diagnosis not present

## 2023-10-14 DIAGNOSIS — L89153 Pressure ulcer of sacral region, stage 3: Secondary | ICD-10-CM | POA: Diagnosis not present

## 2023-10-14 DIAGNOSIS — D62 Acute posthemorrhagic anemia: Secondary | ICD-10-CM | POA: Diagnosis not present

## 2023-10-14 DIAGNOSIS — G8918 Other acute postprocedural pain: Secondary | ICD-10-CM | POA: Diagnosis not present

## 2023-10-14 DIAGNOSIS — M6281 Muscle weakness (generalized): Secondary | ICD-10-CM | POA: Diagnosis not present

## 2023-10-15 DIAGNOSIS — R269 Unspecified abnormalities of gait and mobility: Secondary | ICD-10-CM | POA: Diagnosis not present

## 2023-10-15 DIAGNOSIS — G8918 Other acute postprocedural pain: Secondary | ICD-10-CM | POA: Diagnosis not present

## 2023-10-15 DIAGNOSIS — M6281 Muscle weakness (generalized): Secondary | ICD-10-CM | POA: Diagnosis not present

## 2023-10-15 DIAGNOSIS — S72141D Displaced intertrochanteric fracture of right femur, subsequent encounter for closed fracture with routine healing: Secondary | ICD-10-CM | POA: Diagnosis not present

## 2023-10-15 DIAGNOSIS — D62 Acute posthemorrhagic anemia: Secondary | ICD-10-CM | POA: Diagnosis not present

## 2023-10-15 DIAGNOSIS — L309 Dermatitis, unspecified: Secondary | ICD-10-CM | POA: Diagnosis not present

## 2023-10-15 DIAGNOSIS — T887XXA Unspecified adverse effect of drug or medicament, initial encounter: Secondary | ICD-10-CM | POA: Diagnosis not present

## 2023-10-15 DIAGNOSIS — S31000A Unspecified open wound of lower back and pelvis without penetration into retroperitoneum, initial encounter: Secondary | ICD-10-CM | POA: Diagnosis not present

## 2023-10-15 DIAGNOSIS — M810 Age-related osteoporosis without current pathological fracture: Secondary | ICD-10-CM | POA: Diagnosis not present

## 2023-10-15 DIAGNOSIS — L89153 Pressure ulcer of sacral region, stage 3: Secondary | ICD-10-CM | POA: Diagnosis not present

## 2023-10-15 DIAGNOSIS — R21 Rash and other nonspecific skin eruption: Secondary | ICD-10-CM | POA: Diagnosis not present

## 2023-10-15 DIAGNOSIS — M6284 Sarcopenia: Secondary | ICD-10-CM | POA: Diagnosis not present

## 2023-10-16 DIAGNOSIS — G8918 Other acute postprocedural pain: Secondary | ICD-10-CM | POA: Diagnosis not present

## 2023-10-16 DIAGNOSIS — D62 Acute posthemorrhagic anemia: Secondary | ICD-10-CM | POA: Diagnosis not present

## 2023-10-16 DIAGNOSIS — M6281 Muscle weakness (generalized): Secondary | ICD-10-CM | POA: Diagnosis not present

## 2023-10-16 DIAGNOSIS — S72141D Displaced intertrochanteric fracture of right femur, subsequent encounter for closed fracture with routine healing: Secondary | ICD-10-CM | POA: Diagnosis not present

## 2023-10-16 DIAGNOSIS — L89153 Pressure ulcer of sacral region, stage 3: Secondary | ICD-10-CM | POA: Diagnosis not present

## 2023-10-16 DIAGNOSIS — M810 Age-related osteoporosis without current pathological fracture: Secondary | ICD-10-CM | POA: Diagnosis not present

## 2023-10-18 DIAGNOSIS — L89153 Pressure ulcer of sacral region, stage 3: Secondary | ICD-10-CM | POA: Diagnosis not present

## 2023-10-18 DIAGNOSIS — G8918 Other acute postprocedural pain: Secondary | ICD-10-CM | POA: Diagnosis not present

## 2023-10-18 DIAGNOSIS — M810 Age-related osteoporosis without current pathological fracture: Secondary | ICD-10-CM | POA: Diagnosis not present

## 2023-10-18 DIAGNOSIS — M6281 Muscle weakness (generalized): Secondary | ICD-10-CM | POA: Diagnosis not present

## 2023-10-18 DIAGNOSIS — D62 Acute posthemorrhagic anemia: Secondary | ICD-10-CM | POA: Diagnosis not present

## 2023-10-18 DIAGNOSIS — S72141D Displaced intertrochanteric fracture of right femur, subsequent encounter for closed fracture with routine healing: Secondary | ICD-10-CM | POA: Diagnosis not present

## 2023-10-20 DIAGNOSIS — D62 Acute posthemorrhagic anemia: Secondary | ICD-10-CM | POA: Diagnosis not present

## 2023-10-20 DIAGNOSIS — L89153 Pressure ulcer of sacral region, stage 3: Secondary | ICD-10-CM | POA: Diagnosis not present

## 2023-10-20 DIAGNOSIS — M6281 Muscle weakness (generalized): Secondary | ICD-10-CM | POA: Diagnosis not present

## 2023-10-20 DIAGNOSIS — G8918 Other acute postprocedural pain: Secondary | ICD-10-CM | POA: Diagnosis not present

## 2023-10-20 DIAGNOSIS — S72141D Displaced intertrochanteric fracture of right femur, subsequent encounter for closed fracture with routine healing: Secondary | ICD-10-CM | POA: Diagnosis not present

## 2023-10-20 DIAGNOSIS — M810 Age-related osteoporosis without current pathological fracture: Secondary | ICD-10-CM | POA: Diagnosis not present

## 2023-10-21 DIAGNOSIS — L89153 Pressure ulcer of sacral region, stage 3: Secondary | ICD-10-CM | POA: Diagnosis not present

## 2023-10-21 DIAGNOSIS — S72141D Displaced intertrochanteric fracture of right femur, subsequent encounter for closed fracture with routine healing: Secondary | ICD-10-CM | POA: Diagnosis not present

## 2023-10-21 DIAGNOSIS — M810 Age-related osteoporosis without current pathological fracture: Secondary | ICD-10-CM | POA: Diagnosis not present

## 2023-10-21 DIAGNOSIS — M6281 Muscle weakness (generalized): Secondary | ICD-10-CM | POA: Diagnosis not present

## 2023-10-21 DIAGNOSIS — G8918 Other acute postprocedural pain: Secondary | ICD-10-CM | POA: Diagnosis not present

## 2023-10-21 DIAGNOSIS — D62 Acute posthemorrhagic anemia: Secondary | ICD-10-CM | POA: Diagnosis not present

## 2023-10-23 DIAGNOSIS — M6281 Muscle weakness (generalized): Secondary | ICD-10-CM | POA: Diagnosis not present

## 2023-10-23 DIAGNOSIS — G8918 Other acute postprocedural pain: Secondary | ICD-10-CM | POA: Diagnosis not present

## 2023-10-23 DIAGNOSIS — L89153 Pressure ulcer of sacral region, stage 3: Secondary | ICD-10-CM | POA: Diagnosis not present

## 2023-10-23 DIAGNOSIS — S72141D Displaced intertrochanteric fracture of right femur, subsequent encounter for closed fracture with routine healing: Secondary | ICD-10-CM | POA: Diagnosis not present

## 2023-10-23 DIAGNOSIS — D62 Acute posthemorrhagic anemia: Secondary | ICD-10-CM | POA: Diagnosis not present

## 2023-10-23 DIAGNOSIS — M810 Age-related osteoporosis without current pathological fracture: Secondary | ICD-10-CM | POA: Diagnosis not present

## 2023-10-24 DIAGNOSIS — R2689 Other abnormalities of gait and mobility: Secondary | ICD-10-CM | POA: Diagnosis not present

## 2023-10-24 DIAGNOSIS — Z9989 Dependence on other enabling machines and devices: Secondary | ICD-10-CM | POA: Diagnosis not present

## 2023-10-24 DIAGNOSIS — S31000D Unspecified open wound of lower back and pelvis without penetration into retroperitoneum, subsequent encounter: Secondary | ICD-10-CM | POA: Diagnosis not present

## 2023-10-24 DIAGNOSIS — D509 Iron deficiency anemia, unspecified: Secondary | ICD-10-CM | POA: Diagnosis not present

## 2023-10-24 DIAGNOSIS — M810 Age-related osteoporosis without current pathological fracture: Secondary | ICD-10-CM | POA: Diagnosis not present

## 2023-10-24 DIAGNOSIS — X58XXXA Exposure to other specified factors, initial encounter: Secondary | ICD-10-CM | POA: Diagnosis not present

## 2023-10-25 DIAGNOSIS — D62 Acute posthemorrhagic anemia: Secondary | ICD-10-CM | POA: Diagnosis not present

## 2023-10-25 DIAGNOSIS — G8918 Other acute postprocedural pain: Secondary | ICD-10-CM | POA: Diagnosis not present

## 2023-10-25 DIAGNOSIS — S72141D Displaced intertrochanteric fracture of right femur, subsequent encounter for closed fracture with routine healing: Secondary | ICD-10-CM | POA: Diagnosis not present

## 2023-10-25 DIAGNOSIS — M810 Age-related osteoporosis without current pathological fracture: Secondary | ICD-10-CM | POA: Diagnosis not present

## 2023-10-25 DIAGNOSIS — M6281 Muscle weakness (generalized): Secondary | ICD-10-CM | POA: Diagnosis not present

## 2023-10-25 DIAGNOSIS — L89153 Pressure ulcer of sacral region, stage 3: Secondary | ICD-10-CM | POA: Diagnosis not present

## 2023-10-28 DIAGNOSIS — Z4800 Encounter for change or removal of nonsurgical wound dressing: Secondary | ICD-10-CM | POA: Diagnosis not present

## 2023-10-28 DIAGNOSIS — H04129 Dry eye syndrome of unspecified lacrimal gland: Secondary | ICD-10-CM | POA: Diagnosis not present

## 2023-10-28 DIAGNOSIS — R2689 Other abnormalities of gait and mobility: Secondary | ICD-10-CM | POA: Diagnosis not present

## 2023-10-28 DIAGNOSIS — M80051D Age-related osteoporosis with current pathological fracture, right femur, subsequent encounter for fracture with routine healing: Secondary | ICD-10-CM | POA: Diagnosis not present

## 2023-10-28 DIAGNOSIS — D509 Iron deficiency anemia, unspecified: Secondary | ICD-10-CM | POA: Diagnosis not present

## 2023-10-28 DIAGNOSIS — L89153 Pressure ulcer of sacral region, stage 3: Secondary | ICD-10-CM | POA: Diagnosis not present

## 2023-10-28 DIAGNOSIS — Z9181 History of falling: Secondary | ICD-10-CM | POA: Diagnosis not present

## 2023-10-28 DIAGNOSIS — F17200 Nicotine dependence, unspecified, uncomplicated: Secondary | ICD-10-CM | POA: Diagnosis not present

## 2023-10-29 DIAGNOSIS — D509 Iron deficiency anemia, unspecified: Secondary | ICD-10-CM | POA: Diagnosis not present

## 2023-10-29 DIAGNOSIS — F17200 Nicotine dependence, unspecified, uncomplicated: Secondary | ICD-10-CM | POA: Diagnosis not present

## 2023-10-29 DIAGNOSIS — L89153 Pressure ulcer of sacral region, stage 3: Secondary | ICD-10-CM | POA: Diagnosis not present

## 2023-10-29 DIAGNOSIS — H04129 Dry eye syndrome of unspecified lacrimal gland: Secondary | ICD-10-CM | POA: Diagnosis not present

## 2023-10-29 DIAGNOSIS — M80051D Age-related osteoporosis with current pathological fracture, right femur, subsequent encounter for fracture with routine healing: Secondary | ICD-10-CM | POA: Diagnosis not present

## 2023-10-29 DIAGNOSIS — R2689 Other abnormalities of gait and mobility: Secondary | ICD-10-CM | POA: Diagnosis not present

## 2023-10-30 DIAGNOSIS — L89153 Pressure ulcer of sacral region, stage 3: Secondary | ICD-10-CM | POA: Diagnosis not present

## 2023-10-30 DIAGNOSIS — D509 Iron deficiency anemia, unspecified: Secondary | ICD-10-CM | POA: Diagnosis not present

## 2023-10-30 DIAGNOSIS — R2689 Other abnormalities of gait and mobility: Secondary | ICD-10-CM | POA: Diagnosis not present

## 2023-10-30 DIAGNOSIS — H04129 Dry eye syndrome of unspecified lacrimal gland: Secondary | ICD-10-CM | POA: Diagnosis not present

## 2023-10-30 DIAGNOSIS — M80051D Age-related osteoporosis with current pathological fracture, right femur, subsequent encounter for fracture with routine healing: Secondary | ICD-10-CM | POA: Diagnosis not present

## 2023-10-30 DIAGNOSIS — F17200 Nicotine dependence, unspecified, uncomplicated: Secondary | ICD-10-CM | POA: Diagnosis not present

## 2023-11-01 DIAGNOSIS — D509 Iron deficiency anemia, unspecified: Secondary | ICD-10-CM | POA: Diagnosis not present

## 2023-11-01 DIAGNOSIS — R2681 Unsteadiness on feet: Secondary | ICD-10-CM | POA: Diagnosis not present

## 2023-11-01 DIAGNOSIS — H269 Unspecified cataract: Secondary | ICD-10-CM | POA: Diagnosis not present

## 2023-11-01 DIAGNOSIS — S31000A Unspecified open wound of lower back and pelvis without penetration into retroperitoneum, initial encounter: Secondary | ICD-10-CM | POA: Diagnosis not present

## 2023-11-01 DIAGNOSIS — H04129 Dry eye syndrome of unspecified lacrimal gland: Secondary | ICD-10-CM | POA: Diagnosis not present

## 2023-11-01 DIAGNOSIS — R2689 Other abnormalities of gait and mobility: Secondary | ICD-10-CM | POA: Diagnosis not present

## 2023-11-01 DIAGNOSIS — F17200 Nicotine dependence, unspecified, uncomplicated: Secondary | ICD-10-CM | POA: Diagnosis not present

## 2023-11-01 DIAGNOSIS — L89153 Pressure ulcer of sacral region, stage 3: Secondary | ICD-10-CM | POA: Diagnosis not present

## 2023-11-01 DIAGNOSIS — M80051D Age-related osteoporosis with current pathological fracture, right femur, subsequent encounter for fracture with routine healing: Secondary | ICD-10-CM | POA: Diagnosis not present

## 2023-11-01 DIAGNOSIS — X58XXXA Exposure to other specified factors, initial encounter: Secondary | ICD-10-CM | POA: Diagnosis not present

## 2023-11-01 DIAGNOSIS — M180 Bilateral primary osteoarthritis of first carpometacarpal joints: Secondary | ICD-10-CM | POA: Diagnosis not present

## 2023-11-01 DIAGNOSIS — Z9181 History of falling: Secondary | ICD-10-CM | POA: Diagnosis not present

## 2023-11-04 DIAGNOSIS — L89153 Pressure ulcer of sacral region, stage 3: Secondary | ICD-10-CM | POA: Diagnosis not present

## 2023-11-04 DIAGNOSIS — R2689 Other abnormalities of gait and mobility: Secondary | ICD-10-CM | POA: Diagnosis not present

## 2023-11-04 DIAGNOSIS — D509 Iron deficiency anemia, unspecified: Secondary | ICD-10-CM | POA: Diagnosis not present

## 2023-11-04 DIAGNOSIS — M80051D Age-related osteoporosis with current pathological fracture, right femur, subsequent encounter for fracture with routine healing: Secondary | ICD-10-CM | POA: Diagnosis not present

## 2023-11-04 DIAGNOSIS — H04129 Dry eye syndrome of unspecified lacrimal gland: Secondary | ICD-10-CM | POA: Diagnosis not present

## 2023-11-04 DIAGNOSIS — F17200 Nicotine dependence, unspecified, uncomplicated: Secondary | ICD-10-CM | POA: Diagnosis not present

## 2023-11-05 DIAGNOSIS — D509 Iron deficiency anemia, unspecified: Secondary | ICD-10-CM | POA: Diagnosis not present

## 2023-11-05 DIAGNOSIS — R2689 Other abnormalities of gait and mobility: Secondary | ICD-10-CM | POA: Diagnosis not present

## 2023-11-05 DIAGNOSIS — H04129 Dry eye syndrome of unspecified lacrimal gland: Secondary | ICD-10-CM | POA: Diagnosis not present

## 2023-11-05 DIAGNOSIS — F17200 Nicotine dependence, unspecified, uncomplicated: Secondary | ICD-10-CM | POA: Diagnosis not present

## 2023-11-05 DIAGNOSIS — M80051D Age-related osteoporosis with current pathological fracture, right femur, subsequent encounter for fracture with routine healing: Secondary | ICD-10-CM | POA: Diagnosis not present

## 2023-11-05 DIAGNOSIS — L89153 Pressure ulcer of sacral region, stage 3: Secondary | ICD-10-CM | POA: Diagnosis not present

## 2023-11-06 DIAGNOSIS — H04129 Dry eye syndrome of unspecified lacrimal gland: Secondary | ICD-10-CM | POA: Diagnosis not present

## 2023-11-06 DIAGNOSIS — F17200 Nicotine dependence, unspecified, uncomplicated: Secondary | ICD-10-CM | POA: Diagnosis not present

## 2023-11-06 DIAGNOSIS — R2689 Other abnormalities of gait and mobility: Secondary | ICD-10-CM | POA: Diagnosis not present

## 2023-11-06 DIAGNOSIS — M80051D Age-related osteoporosis with current pathological fracture, right femur, subsequent encounter for fracture with routine healing: Secondary | ICD-10-CM | POA: Diagnosis not present

## 2023-11-06 DIAGNOSIS — D509 Iron deficiency anemia, unspecified: Secondary | ICD-10-CM | POA: Diagnosis not present

## 2023-11-06 DIAGNOSIS — L89153 Pressure ulcer of sacral region, stage 3: Secondary | ICD-10-CM | POA: Diagnosis not present

## 2023-11-07 DIAGNOSIS — D509 Iron deficiency anemia, unspecified: Secondary | ICD-10-CM | POA: Diagnosis not present

## 2023-11-07 DIAGNOSIS — H04129 Dry eye syndrome of unspecified lacrimal gland: Secondary | ICD-10-CM | POA: Diagnosis not present

## 2023-11-07 DIAGNOSIS — M80051D Age-related osteoporosis with current pathological fracture, right femur, subsequent encounter for fracture with routine healing: Secondary | ICD-10-CM | POA: Diagnosis not present

## 2023-11-07 DIAGNOSIS — L89153 Pressure ulcer of sacral region, stage 3: Secondary | ICD-10-CM | POA: Diagnosis not present

## 2023-11-07 DIAGNOSIS — F17200 Nicotine dependence, unspecified, uncomplicated: Secondary | ICD-10-CM | POA: Diagnosis not present

## 2023-11-07 DIAGNOSIS — R2689 Other abnormalities of gait and mobility: Secondary | ICD-10-CM | POA: Diagnosis not present

## 2023-11-08 DIAGNOSIS — S72141D Displaced intertrochanteric fracture of right femur, subsequent encounter for closed fracture with routine healing: Secondary | ICD-10-CM | POA: Diagnosis not present

## 2023-11-08 DIAGNOSIS — L89153 Pressure ulcer of sacral region, stage 3: Secondary | ICD-10-CM | POA: Diagnosis not present

## 2023-11-11 DIAGNOSIS — H04129 Dry eye syndrome of unspecified lacrimal gland: Secondary | ICD-10-CM | POA: Diagnosis not present

## 2023-11-11 DIAGNOSIS — R2689 Other abnormalities of gait and mobility: Secondary | ICD-10-CM | POA: Diagnosis not present

## 2023-11-11 DIAGNOSIS — D509 Iron deficiency anemia, unspecified: Secondary | ICD-10-CM | POA: Diagnosis not present

## 2023-11-11 DIAGNOSIS — L89153 Pressure ulcer of sacral region, stage 3: Secondary | ICD-10-CM | POA: Diagnosis not present

## 2023-11-11 DIAGNOSIS — M80051D Age-related osteoporosis with current pathological fracture, right femur, subsequent encounter for fracture with routine healing: Secondary | ICD-10-CM | POA: Diagnosis not present

## 2023-11-11 DIAGNOSIS — F17200 Nicotine dependence, unspecified, uncomplicated: Secondary | ICD-10-CM | POA: Diagnosis not present

## 2023-11-12 DIAGNOSIS — L03011 Cellulitis of right finger: Secondary | ICD-10-CM | POA: Diagnosis not present

## 2023-11-12 DIAGNOSIS — L304 Erythema intertrigo: Secondary | ICD-10-CM | POA: Diagnosis not present

## 2023-11-12 DIAGNOSIS — R5381 Other malaise: Secondary | ICD-10-CM | POA: Diagnosis not present

## 2023-11-12 DIAGNOSIS — M6284 Sarcopenia: Secondary | ICD-10-CM | POA: Diagnosis not present

## 2023-11-12 DIAGNOSIS — R2681 Unsteadiness on feet: Secondary | ICD-10-CM | POA: Diagnosis not present

## 2023-11-12 DIAGNOSIS — R410 Disorientation, unspecified: Secondary | ICD-10-CM | POA: Diagnosis not present

## 2023-11-13 DIAGNOSIS — H04129 Dry eye syndrome of unspecified lacrimal gland: Secondary | ICD-10-CM | POA: Diagnosis not present

## 2023-11-13 DIAGNOSIS — R2689 Other abnormalities of gait and mobility: Secondary | ICD-10-CM | POA: Diagnosis not present

## 2023-11-13 DIAGNOSIS — D509 Iron deficiency anemia, unspecified: Secondary | ICD-10-CM | POA: Diagnosis not present

## 2023-11-13 DIAGNOSIS — M80051D Age-related osteoporosis with current pathological fracture, right femur, subsequent encounter for fracture with routine healing: Secondary | ICD-10-CM | POA: Diagnosis not present

## 2023-11-13 DIAGNOSIS — F17200 Nicotine dependence, unspecified, uncomplicated: Secondary | ICD-10-CM | POA: Diagnosis not present

## 2023-11-13 DIAGNOSIS — L89153 Pressure ulcer of sacral region, stage 3: Secondary | ICD-10-CM | POA: Diagnosis not present

## 2023-11-14 DIAGNOSIS — R2689 Other abnormalities of gait and mobility: Secondary | ICD-10-CM | POA: Diagnosis not present

## 2023-11-14 DIAGNOSIS — M80051D Age-related osteoporosis with current pathological fracture, right femur, subsequent encounter for fracture with routine healing: Secondary | ICD-10-CM | POA: Diagnosis not present

## 2023-11-14 DIAGNOSIS — L89153 Pressure ulcer of sacral region, stage 3: Secondary | ICD-10-CM | POA: Diagnosis not present

## 2023-11-14 DIAGNOSIS — H04129 Dry eye syndrome of unspecified lacrimal gland: Secondary | ICD-10-CM | POA: Diagnosis not present

## 2023-11-14 DIAGNOSIS — D509 Iron deficiency anemia, unspecified: Secondary | ICD-10-CM | POA: Diagnosis not present

## 2023-11-14 DIAGNOSIS — F17200 Nicotine dependence, unspecified, uncomplicated: Secondary | ICD-10-CM | POA: Diagnosis not present

## 2023-11-15 DIAGNOSIS — L89153 Pressure ulcer of sacral region, stage 3: Secondary | ICD-10-CM | POA: Diagnosis not present

## 2023-11-18 DIAGNOSIS — L600 Ingrowing nail: Secondary | ICD-10-CM | POA: Diagnosis not present

## 2023-11-18 DIAGNOSIS — B351 Tinea unguium: Secondary | ICD-10-CM | POA: Diagnosis not present

## 2023-11-19 DIAGNOSIS — R2689 Other abnormalities of gait and mobility: Secondary | ICD-10-CM | POA: Diagnosis not present

## 2023-11-19 DIAGNOSIS — D509 Iron deficiency anemia, unspecified: Secondary | ICD-10-CM | POA: Diagnosis not present

## 2023-11-19 DIAGNOSIS — H04129 Dry eye syndrome of unspecified lacrimal gland: Secondary | ICD-10-CM | POA: Diagnosis not present

## 2023-11-19 DIAGNOSIS — L89153 Pressure ulcer of sacral region, stage 3: Secondary | ICD-10-CM | POA: Diagnosis not present

## 2023-11-19 DIAGNOSIS — M80051D Age-related osteoporosis with current pathological fracture, right femur, subsequent encounter for fracture with routine healing: Secondary | ICD-10-CM | POA: Diagnosis not present

## 2023-11-19 DIAGNOSIS — F17200 Nicotine dependence, unspecified, uncomplicated: Secondary | ICD-10-CM | POA: Diagnosis not present

## 2023-11-26 DIAGNOSIS — B372 Candidiasis of skin and nail: Secondary | ICD-10-CM | POA: Diagnosis not present

## 2023-11-26 DIAGNOSIS — L03011 Cellulitis of right finger: Secondary | ICD-10-CM | POA: Diagnosis not present

## 2023-11-26 DIAGNOSIS — R531 Weakness: Secondary | ICD-10-CM | POA: Diagnosis not present

## 2023-11-26 DIAGNOSIS — Z9181 History of falling: Secondary | ICD-10-CM | POA: Diagnosis not present

## 2023-11-26 DIAGNOSIS — M810 Age-related osteoporosis without current pathological fracture: Secondary | ICD-10-CM | POA: Diagnosis not present

## 2023-11-26 DIAGNOSIS — M6284 Sarcopenia: Secondary | ICD-10-CM | POA: Diagnosis not present

## 2023-11-26 DIAGNOSIS — R2689 Other abnormalities of gait and mobility: Secondary | ICD-10-CM | POA: Diagnosis not present

## 2023-12-03 DIAGNOSIS — Z23 Encounter for immunization: Secondary | ICD-10-CM | POA: Diagnosis not present

## 2023-12-10 DIAGNOSIS — R2689 Other abnormalities of gait and mobility: Secondary | ICD-10-CM | POA: Diagnosis not present

## 2023-12-10 DIAGNOSIS — M810 Age-related osteoporosis without current pathological fracture: Secondary | ICD-10-CM | POA: Diagnosis not present

## 2023-12-10 DIAGNOSIS — M6284 Sarcopenia: Secondary | ICD-10-CM | POA: Diagnosis not present

## 2023-12-10 DIAGNOSIS — M6281 Muscle weakness (generalized): Secondary | ICD-10-CM | POA: Diagnosis not present

## 2023-12-12 DIAGNOSIS — M6284 Sarcopenia: Secondary | ICD-10-CM | POA: Diagnosis not present

## 2023-12-12 DIAGNOSIS — M6281 Muscle weakness (generalized): Secondary | ICD-10-CM | POA: Diagnosis not present

## 2023-12-12 DIAGNOSIS — R2689 Other abnormalities of gait and mobility: Secondary | ICD-10-CM | POA: Diagnosis not present

## 2023-12-12 DIAGNOSIS — M810 Age-related osteoporosis without current pathological fracture: Secondary | ICD-10-CM | POA: Diagnosis not present

## 2023-12-13 DIAGNOSIS — M810 Age-related osteoporosis without current pathological fracture: Secondary | ICD-10-CM | POA: Diagnosis not present

## 2023-12-13 DIAGNOSIS — M6284 Sarcopenia: Secondary | ICD-10-CM | POA: Diagnosis not present

## 2023-12-13 DIAGNOSIS — M6281 Muscle weakness (generalized): Secondary | ICD-10-CM | POA: Diagnosis not present

## 2023-12-13 DIAGNOSIS — R2689 Other abnormalities of gait and mobility: Secondary | ICD-10-CM | POA: Diagnosis not present

## 2023-12-17 DIAGNOSIS — M810 Age-related osteoporosis without current pathological fracture: Secondary | ICD-10-CM | POA: Diagnosis not present

## 2023-12-17 DIAGNOSIS — M6281 Muscle weakness (generalized): Secondary | ICD-10-CM | POA: Diagnosis not present

## 2023-12-17 DIAGNOSIS — M6284 Sarcopenia: Secondary | ICD-10-CM | POA: Diagnosis not present

## 2023-12-17 DIAGNOSIS — R2689 Other abnormalities of gait and mobility: Secondary | ICD-10-CM | POA: Diagnosis not present

## 2023-12-18 DIAGNOSIS — M810 Age-related osteoporosis without current pathological fracture: Secondary | ICD-10-CM | POA: Diagnosis not present

## 2023-12-18 DIAGNOSIS — R2689 Other abnormalities of gait and mobility: Secondary | ICD-10-CM | POA: Diagnosis not present

## 2023-12-18 DIAGNOSIS — M6284 Sarcopenia: Secondary | ICD-10-CM | POA: Diagnosis not present

## 2023-12-18 DIAGNOSIS — M6281 Muscle weakness (generalized): Secondary | ICD-10-CM | POA: Diagnosis not present

## 2023-12-19 DIAGNOSIS — H43393 Other vitreous opacities, bilateral: Secondary | ICD-10-CM | POA: Diagnosis not present

## 2023-12-19 DIAGNOSIS — H401131 Primary open-angle glaucoma, bilateral, mild stage: Secondary | ICD-10-CM | POA: Diagnosis not present

## 2023-12-19 DIAGNOSIS — H35363 Drusen (degenerative) of macula, bilateral: Secondary | ICD-10-CM | POA: Diagnosis not present

## 2023-12-19 DIAGNOSIS — H16223 Keratoconjunctivitis sicca, not specified as Sjogren's, bilateral: Secondary | ICD-10-CM | POA: Diagnosis not present

## 2023-12-20 DIAGNOSIS — M6284 Sarcopenia: Secondary | ICD-10-CM | POA: Diagnosis not present

## 2023-12-20 DIAGNOSIS — M6281 Muscle weakness (generalized): Secondary | ICD-10-CM | POA: Diagnosis not present

## 2023-12-20 DIAGNOSIS — M810 Age-related osteoporosis without current pathological fracture: Secondary | ICD-10-CM | POA: Diagnosis not present

## 2023-12-20 DIAGNOSIS — R2689 Other abnormalities of gait and mobility: Secondary | ICD-10-CM | POA: Diagnosis not present

## 2023-12-23 DIAGNOSIS — M810 Age-related osteoporosis without current pathological fracture: Secondary | ICD-10-CM | POA: Diagnosis not present

## 2023-12-23 DIAGNOSIS — M6281 Muscle weakness (generalized): Secondary | ICD-10-CM | POA: Diagnosis not present

## 2023-12-23 DIAGNOSIS — R2689 Other abnormalities of gait and mobility: Secondary | ICD-10-CM | POA: Diagnosis not present

## 2023-12-23 DIAGNOSIS — M6284 Sarcopenia: Secondary | ICD-10-CM | POA: Diagnosis not present

## 2023-12-24 DIAGNOSIS — M6284 Sarcopenia: Secondary | ICD-10-CM | POA: Diagnosis not present

## 2023-12-24 DIAGNOSIS — M810 Age-related osteoporosis without current pathological fracture: Secondary | ICD-10-CM | POA: Diagnosis not present

## 2023-12-24 DIAGNOSIS — R2689 Other abnormalities of gait and mobility: Secondary | ICD-10-CM | POA: Diagnosis not present

## 2023-12-24 DIAGNOSIS — M6281 Muscle weakness (generalized): Secondary | ICD-10-CM | POA: Diagnosis not present

## 2023-12-26 DIAGNOSIS — M6281 Muscle weakness (generalized): Secondary | ICD-10-CM | POA: Diagnosis not present

## 2023-12-26 DIAGNOSIS — M810 Age-related osteoporosis without current pathological fracture: Secondary | ICD-10-CM | POA: Diagnosis not present

## 2023-12-26 DIAGNOSIS — M6284 Sarcopenia: Secondary | ICD-10-CM | POA: Diagnosis not present

## 2023-12-26 DIAGNOSIS — R2689 Other abnormalities of gait and mobility: Secondary | ICD-10-CM | POA: Diagnosis not present

## 2023-12-30 DIAGNOSIS — R2689 Other abnormalities of gait and mobility: Secondary | ICD-10-CM | POA: Diagnosis not present

## 2023-12-30 DIAGNOSIS — M6284 Sarcopenia: Secondary | ICD-10-CM | POA: Diagnosis not present

## 2023-12-30 DIAGNOSIS — M810 Age-related osteoporosis without current pathological fracture: Secondary | ICD-10-CM | POA: Diagnosis not present

## 2023-12-30 DIAGNOSIS — M6281 Muscle weakness (generalized): Secondary | ICD-10-CM | POA: Diagnosis not present

## 2023-12-31 DIAGNOSIS — M6281 Muscle weakness (generalized): Secondary | ICD-10-CM | POA: Diagnosis not present

## 2023-12-31 DIAGNOSIS — M810 Age-related osteoporosis without current pathological fracture: Secondary | ICD-10-CM | POA: Diagnosis not present

## 2023-12-31 DIAGNOSIS — M6284 Sarcopenia: Secondary | ICD-10-CM | POA: Diagnosis not present

## 2023-12-31 DIAGNOSIS — R2689 Other abnormalities of gait and mobility: Secondary | ICD-10-CM | POA: Diagnosis not present

## 2024-01-02 DIAGNOSIS — M6284 Sarcopenia: Secondary | ICD-10-CM | POA: Diagnosis not present

## 2024-01-02 DIAGNOSIS — M810 Age-related osteoporosis without current pathological fracture: Secondary | ICD-10-CM | POA: Diagnosis not present

## 2024-01-02 DIAGNOSIS — M6281 Muscle weakness (generalized): Secondary | ICD-10-CM | POA: Diagnosis not present

## 2024-01-02 DIAGNOSIS — R2689 Other abnormalities of gait and mobility: Secondary | ICD-10-CM | POA: Diagnosis not present

## 2024-01-06 DIAGNOSIS — R2689 Other abnormalities of gait and mobility: Secondary | ICD-10-CM | POA: Diagnosis not present

## 2024-01-06 DIAGNOSIS — M6284 Sarcopenia: Secondary | ICD-10-CM | POA: Diagnosis not present

## 2024-01-06 DIAGNOSIS — M6281 Muscle weakness (generalized): Secondary | ICD-10-CM | POA: Diagnosis not present

## 2024-01-06 DIAGNOSIS — M810 Age-related osteoporosis without current pathological fracture: Secondary | ICD-10-CM | POA: Diagnosis not present

## 2024-01-07 DIAGNOSIS — M6281 Muscle weakness (generalized): Secondary | ICD-10-CM | POA: Diagnosis not present

## 2024-01-07 DIAGNOSIS — M810 Age-related osteoporosis without current pathological fracture: Secondary | ICD-10-CM | POA: Diagnosis not present

## 2024-01-07 DIAGNOSIS — R2689 Other abnormalities of gait and mobility: Secondary | ICD-10-CM | POA: Diagnosis not present

## 2024-01-07 DIAGNOSIS — M6284 Sarcopenia: Secondary | ICD-10-CM | POA: Diagnosis not present

## 2024-01-09 DIAGNOSIS — M6281 Muscle weakness (generalized): Secondary | ICD-10-CM | POA: Diagnosis not present

## 2024-01-09 DIAGNOSIS — M810 Age-related osteoporosis without current pathological fracture: Secondary | ICD-10-CM | POA: Diagnosis not present

## 2024-01-09 DIAGNOSIS — M6284 Sarcopenia: Secondary | ICD-10-CM | POA: Diagnosis not present

## 2024-01-09 DIAGNOSIS — R2689 Other abnormalities of gait and mobility: Secondary | ICD-10-CM | POA: Diagnosis not present

## 2024-01-13 DIAGNOSIS — M6281 Muscle weakness (generalized): Secondary | ICD-10-CM | POA: Diagnosis not present

## 2024-01-13 DIAGNOSIS — R2689 Other abnormalities of gait and mobility: Secondary | ICD-10-CM | POA: Diagnosis not present

## 2024-01-14 DIAGNOSIS — M810 Age-related osteoporosis without current pathological fracture: Secondary | ICD-10-CM | POA: Diagnosis not present

## 2024-01-14 DIAGNOSIS — R2689 Other abnormalities of gait and mobility: Secondary | ICD-10-CM | POA: Diagnosis not present

## 2024-01-14 DIAGNOSIS — M6281 Muscle weakness (generalized): Secondary | ICD-10-CM | POA: Diagnosis not present

## 2024-01-14 DIAGNOSIS — M6284 Sarcopenia: Secondary | ICD-10-CM | POA: Diagnosis not present

## 2024-01-16 DIAGNOSIS — M6284 Sarcopenia: Secondary | ICD-10-CM | POA: Diagnosis not present

## 2024-01-16 DIAGNOSIS — M810 Age-related osteoporosis without current pathological fracture: Secondary | ICD-10-CM | POA: Diagnosis not present

## 2024-01-16 DIAGNOSIS — M6281 Muscle weakness (generalized): Secondary | ICD-10-CM | POA: Diagnosis not present

## 2024-01-16 DIAGNOSIS — R2689 Other abnormalities of gait and mobility: Secondary | ICD-10-CM | POA: Diagnosis not present

## 2024-01-20 DIAGNOSIS — R2689 Other abnormalities of gait and mobility: Secondary | ICD-10-CM | POA: Diagnosis not present

## 2024-01-20 DIAGNOSIS — M6284 Sarcopenia: Secondary | ICD-10-CM | POA: Diagnosis not present

## 2024-01-20 DIAGNOSIS — M6281 Muscle weakness (generalized): Secondary | ICD-10-CM | POA: Diagnosis not present

## 2024-01-20 DIAGNOSIS — M810 Age-related osteoporosis without current pathological fracture: Secondary | ICD-10-CM | POA: Diagnosis not present

## 2024-01-21 DIAGNOSIS — M6281 Muscle weakness (generalized): Secondary | ICD-10-CM | POA: Diagnosis not present

## 2024-01-21 DIAGNOSIS — M6284 Sarcopenia: Secondary | ICD-10-CM | POA: Diagnosis not present

## 2024-01-21 DIAGNOSIS — M810 Age-related osteoporosis without current pathological fracture: Secondary | ICD-10-CM | POA: Diagnosis not present

## 2024-01-23 DIAGNOSIS — M810 Age-related osteoporosis without current pathological fracture: Secondary | ICD-10-CM | POA: Diagnosis not present

## 2024-01-23 DIAGNOSIS — R2689 Other abnormalities of gait and mobility: Secondary | ICD-10-CM | POA: Diagnosis not present

## 2024-01-23 DIAGNOSIS — M6284 Sarcopenia: Secondary | ICD-10-CM | POA: Diagnosis not present

## 2024-01-23 DIAGNOSIS — M6281 Muscle weakness (generalized): Secondary | ICD-10-CM | POA: Diagnosis not present

## 2024-01-24 DIAGNOSIS — M6281 Muscle weakness (generalized): Secondary | ICD-10-CM | POA: Diagnosis not present

## 2024-01-24 DIAGNOSIS — R2689 Other abnormalities of gait and mobility: Secondary | ICD-10-CM | POA: Diagnosis not present

## 2024-01-27 DIAGNOSIS — R799 Abnormal finding of blood chemistry, unspecified: Secondary | ICD-10-CM | POA: Diagnosis not present

## 2024-01-27 DIAGNOSIS — E559 Vitamin D deficiency, unspecified: Secondary | ICD-10-CM | POA: Diagnosis not present

## 2024-01-27 DIAGNOSIS — R946 Abnormal results of thyroid function studies: Secondary | ICD-10-CM | POA: Diagnosis not present

## 2024-01-28 DIAGNOSIS — R2689 Other abnormalities of gait and mobility: Secondary | ICD-10-CM | POA: Diagnosis not present

## 2024-01-28 DIAGNOSIS — M6281 Muscle weakness (generalized): Secondary | ICD-10-CM | POA: Diagnosis not present

## 2024-01-28 DIAGNOSIS — M6284 Sarcopenia: Secondary | ICD-10-CM | POA: Diagnosis not present

## 2024-01-28 DIAGNOSIS — M810 Age-related osteoporosis without current pathological fracture: Secondary | ICD-10-CM | POA: Diagnosis not present

## 2024-01-29 DIAGNOSIS — M6281 Muscle weakness (generalized): Secondary | ICD-10-CM | POA: Diagnosis not present

## 2024-01-29 DIAGNOSIS — M6284 Sarcopenia: Secondary | ICD-10-CM | POA: Diagnosis not present

## 2024-01-29 DIAGNOSIS — M810 Age-related osteoporosis without current pathological fracture: Secondary | ICD-10-CM | POA: Diagnosis not present

## 2024-01-29 DIAGNOSIS — R2689 Other abnormalities of gait and mobility: Secondary | ICD-10-CM | POA: Diagnosis not present

## 2024-01-30 DIAGNOSIS — M6284 Sarcopenia: Secondary | ICD-10-CM | POA: Diagnosis not present

## 2024-01-30 DIAGNOSIS — R2689 Other abnormalities of gait and mobility: Secondary | ICD-10-CM | POA: Diagnosis not present

## 2024-01-30 DIAGNOSIS — M6281 Muscle weakness (generalized): Secondary | ICD-10-CM | POA: Diagnosis not present

## 2024-01-30 DIAGNOSIS — M810 Age-related osteoporosis without current pathological fracture: Secondary | ICD-10-CM | POA: Diagnosis not present

## 2024-02-02 DIAGNOSIS — M6281 Muscle weakness (generalized): Secondary | ICD-10-CM | POA: Diagnosis not present

## 2024-02-02 DIAGNOSIS — M810 Age-related osteoporosis without current pathological fracture: Secondary | ICD-10-CM | POA: Diagnosis not present

## 2024-02-02 DIAGNOSIS — R2689 Other abnormalities of gait and mobility: Secondary | ICD-10-CM | POA: Diagnosis not present

## 2024-02-02 DIAGNOSIS — M6284 Sarcopenia: Secondary | ICD-10-CM | POA: Diagnosis not present

## 2024-02-03 DIAGNOSIS — R2689 Other abnormalities of gait and mobility: Secondary | ICD-10-CM | POA: Diagnosis not present

## 2024-02-03 DIAGNOSIS — M6281 Muscle weakness (generalized): Secondary | ICD-10-CM | POA: Diagnosis not present

## 2024-02-03 DIAGNOSIS — M810 Age-related osteoporosis without current pathological fracture: Secondary | ICD-10-CM | POA: Diagnosis not present

## 2024-02-03 DIAGNOSIS — M6284 Sarcopenia: Secondary | ICD-10-CM | POA: Diagnosis not present

## 2024-02-04 DIAGNOSIS — R2689 Other abnormalities of gait and mobility: Secondary | ICD-10-CM | POA: Diagnosis not present

## 2024-02-04 DIAGNOSIS — M6281 Muscle weakness (generalized): Secondary | ICD-10-CM | POA: Diagnosis not present

## 2024-02-04 DIAGNOSIS — M6284 Sarcopenia: Secondary | ICD-10-CM | POA: Diagnosis not present

## 2024-02-04 DIAGNOSIS — M810 Age-related osteoporosis without current pathological fracture: Secondary | ICD-10-CM | POA: Diagnosis not present

## 2024-02-10 DIAGNOSIS — M810 Age-related osteoporosis without current pathological fracture: Secondary | ICD-10-CM | POA: Diagnosis not present

## 2024-02-10 DIAGNOSIS — M6281 Muscle weakness (generalized): Secondary | ICD-10-CM | POA: Diagnosis not present

## 2024-02-10 DIAGNOSIS — M6284 Sarcopenia: Secondary | ICD-10-CM | POA: Diagnosis not present

## 2024-02-10 DIAGNOSIS — R2689 Other abnormalities of gait and mobility: Secondary | ICD-10-CM | POA: Diagnosis not present

## 2024-02-11 DIAGNOSIS — R2689 Other abnormalities of gait and mobility: Secondary | ICD-10-CM | POA: Diagnosis not present

## 2024-02-11 DIAGNOSIS — M6284 Sarcopenia: Secondary | ICD-10-CM | POA: Diagnosis not present

## 2024-02-11 DIAGNOSIS — M6281 Muscle weakness (generalized): Secondary | ICD-10-CM | POA: Diagnosis not present

## 2024-02-11 DIAGNOSIS — M810 Age-related osteoporosis without current pathological fracture: Secondary | ICD-10-CM | POA: Diagnosis not present

## 2024-02-13 DIAGNOSIS — M6284 Sarcopenia: Secondary | ICD-10-CM | POA: Diagnosis not present

## 2024-02-13 DIAGNOSIS — M810 Age-related osteoporosis without current pathological fracture: Secondary | ICD-10-CM | POA: Diagnosis not present

## 2024-02-13 DIAGNOSIS — R2689 Other abnormalities of gait and mobility: Secondary | ICD-10-CM | POA: Diagnosis not present

## 2024-02-13 DIAGNOSIS — M6281 Muscle weakness (generalized): Secondary | ICD-10-CM | POA: Diagnosis not present

## 2024-02-18 DIAGNOSIS — B351 Tinea unguium: Secondary | ICD-10-CM | POA: Diagnosis not present

## 2024-02-20 ENCOUNTER — Ambulatory Visit: Payer: Medicare Other | Admitting: Family Medicine
